# Patient Record
Sex: Female | Born: 1960 | Race: White | Hispanic: No | Marital: Single | Smoking: Current every day smoker
Health system: Southern US, Community
[De-identification: ages and names within clinical notes are randomized; demographics above are authoritative.]

## PROBLEM LIST (undated history)

## (undated) DIAGNOSIS — E785 Hyperlipidemia, unspecified: Secondary | ICD-10-CM

## (undated) DIAGNOSIS — I639 Cerebral infarction, unspecified: Secondary | ICD-10-CM

## (undated) DIAGNOSIS — I10 Essential (primary) hypertension: Secondary | ICD-10-CM

## (undated) HISTORY — PX: TONSILLECTOMY: SUR1361

## (undated) HISTORY — DX: Cerebral infarction, unspecified: I63.9

## (undated) HISTORY — DX: Essential (primary) hypertension: I10

## (undated) HISTORY — PX: TUBAL LIGATION: SHX77

## (undated) HISTORY — DX: Hyperlipidemia, unspecified: E78.5

---

## 2005-04-24 ENCOUNTER — Emergency Department: Payer: Self-pay | Admitting: General Practice

## 2005-05-02 ENCOUNTER — Ambulatory Visit: Payer: Self-pay | Admitting: General Practice

## 2006-10-15 ENCOUNTER — Ambulatory Visit: Payer: Self-pay | Admitting: Obstetrics and Gynecology

## 2006-10-23 ENCOUNTER — Ambulatory Visit: Payer: Self-pay | Admitting: Obstetrics and Gynecology

## 2011-08-05 ENCOUNTER — Ambulatory Visit: Payer: Self-pay

## 2013-01-07 ENCOUNTER — Emergency Department: Payer: Self-pay | Admitting: Emergency Medicine

## 2014-01-02 ENCOUNTER — Emergency Department: Payer: Self-pay | Admitting: Emergency Medicine

## 2014-08-22 ENCOUNTER — Emergency Department: Payer: Self-pay | Admitting: Internal Medicine

## 2015-05-31 LAB — COMP. METABOLIC PANEL (12)
ALT: 12 (ref 3–30)
AST: 13
Albumin Serum: 4.6
Albumin/Globulin Ratio: 1.8
Alkaline Phosphatase: 94
BUN/Creatinine Ratio: 14
BUN: 13
Calcium: 9.6
Carbon Dioxide, Total: 28
Chloride: 101
Creat: 0.92
EGFR (African American): 82
EGFR (Non-African Amer.): 71
Globulin, Total: 2.5
Glucose: 90
Potassium: 4.5
Sodium: 114
Total Bilirubin: 0.3
Total Protein: 7.1 g/dL

## 2015-05-31 LAB — CBC WITH DIFFERENTIAL/PLATELET
Basophils Absolute: 0
Basophils: 1
Eosinophil: 4
Eosinophils Absolute: 0.1
HCT: 37 (ref 29–41)
Hemoglobin: 12.8
Immature Granulocytes: 0
Lymphocytes absolute: 1.5 10*3/uL (ref 0.1–1.8)
Lymphocytes: 53
MCH: 31.1
MCHC: 34.4
MCV: 91 (ref 76–111)
Monocyes absolute: 0.3 10*3/uL (ref 0.1–1)
Monocytes: 10
Neutrophils absolute (GR#): 0.9 10*3/uL — AB (ref 1.7–7.8)
Neutrophils: 32
Platelets: 143
RBC: 4.11
RDW: 13.6
WBC: 2.8

## 2015-05-31 LAB — VITAMIN D, 1,25 + 25-HYDROXY: Vitamin D, 1, 25-Dihydroxy: 29.5

## 2015-05-31 LAB — HEMOGLOBIN A1C: Hemoglobin A1C: 4.9

## 2015-05-31 LAB — LIPID PANEL WITH LDL/HDL RATIO
Cholesterol, Total: 173
HDL Cholesterol: 35 (ref 35–70)
LDL Cholesterol (Calc): 87
LDl/HDL Ratio: 2.5
Triglycerides: 253 — AB (ref 40–160)
VLDL Cholesterol Cal: 51

## 2015-05-31 LAB — RHEUMATOID FACTOR: RA Latex Turbid.: <10

## 2015-05-31 LAB — TSH: TSH: 2.08

## 2015-05-31 LAB — SEDIMENTATION RATE: Sedimentation Rate-Westergren: 18

## 2015-05-31 LAB — HCV ANTIBODY: Hep C Virus Ab: >11

## 2015-05-31 LAB — ANTINUCLEAR ANTIBODIES, IFA: ANA Ab, IFA: NEGATIVE

## 2015-06-06 LAB — CBC WITH DIFFERENTIAL/PLATELET
Basophils Absolute: 0
Basophils: 0
Eosinophil: 3
Eosinophils Absolute: 0.1
HCT: 34 (ref 29–41)
Hemoglobin: 11.7
Immature Granulocytes: 0
Lymphocytes absolute: 1.6 10*3/uL (ref 0.1–1.8)
Lymphocytes: 48
MCH: 30.8
MCHC: 34.6
MCV: 89 (ref 76–111)
Monocyes absolute: 0.3 10*3/uL (ref 0.1–1)
Monocytes: 10
Neutrophils absolute (GR#): 1.3 10*3/uL — AB (ref 1.7–7.8)
Neutrophils: 39
Platelets: 137
RBC: 3.8
RDW: 13.6
WBC: 3.2

## 2015-07-11 LAB — RPR: RPR: NONREACTIVE

## 2015-07-11 LAB — HM HIV SCREENING LAB: HIV Screen 4th Generation wRfx: NONREACTIVE

## 2015-07-12 LAB — GC/CHLAMYDIA PROBE AMP, URINE
Chlamydia trachomatis, NAA: NEGATIVE
Neisseria gonorrhoeae, NAA: NEGATIVE

## 2015-08-06 ENCOUNTER — Ambulatory Visit: Payer: Self-pay

## 2015-08-08 ENCOUNTER — Ambulatory Visit
Admission: RE | Admit: 2015-08-08 | Discharge: 2015-08-08 | Disposition: A | Discharge status: Home / Self Care | Payer: Self-pay | Source: Ambulatory Visit | Attending: Oncology | Admitting: Oncology

## 2015-08-08 ENCOUNTER — Ambulatory Visit: Payer: Self-pay | Attending: Oncology

## 2015-08-08 VITALS — BP 154/97 | HR 73 | Temp 97.0°F | Resp 18 | Ht 66.1 in | Wt 166.7 lb

## 2015-08-08 DIAGNOSIS — Z0000 Encounter for general adult medical examination without abnormal findings: Secondary | ICD-10-CM

## 2015-08-08 NOTE — Progress Notes (Signed)
Subjective:     Patient ID: Desiree Sexton, female   DOB: [birth year], 54 y.o.   MRN: [id number]  HPI   Review of Systems     Objective:   Physical Exam Physical Exam  Pulmonary/Chest: Right breast exhibits no inverted nipple, no mass, no nipple discharge, no skin change and no tenderness. Left breast exhibits no inverted nipple, no mass, no nipple discharge, no skin change and no tenderness. Breasts are symmetrical.      Assessment:     54 year old patient presents for [facility] clinic visitPatient screened, and meets [facility] eligibility.  Patient does not have insurance, Medicare or Medicaid.  Handout given on Affordable Care Act. CBE unremarkable.  Instructed patient on breast self-exam using teach back method .    Plan:     Sent for bilateral screening mammogram.

## 2015-08-08 NOTE — Progress Notes (Signed)
Subjective:     Patient ID: Desiree Sexton, female   DOB: November 16, 1960, 54 y.o.   MRN: [id number]  HPI   Review of Systems     Objective:   Physical Exam  Pulmonary/Chest: Right breast exhibits no inverted nipple, no mass, no nipple discharge, no skin change and no tenderness. Left breast exhibits no inverted nipple, no mass, no nipple discharge, no skin change and no tenderness. Breasts are symmetrical.       Assessment:     54 [facility] Patient screened, and meets [facility] eligibility.  Patient does not have insurance, Medicare or Medicaid.  Handout given on Affordable Care Act. CBE unremarkable. Instructed patient on breast self-exam using teach back method.     Plan:     Sent for bilateral screening mammogram.

## 2015-11-19 NOTE — Progress Notes (Signed)
Copy to [facility]. 

## 2016-12-03 ENCOUNTER — Ambulatory Visit: Payer: Self-pay | Attending: Oncology

## 2016-12-03 ENCOUNTER — Encounter (INDEPENDENT_AMBULATORY_CARE_PROVIDER_SITE_OTHER): Payer: Self-pay

## 2016-12-03 ENCOUNTER — Ambulatory Visit
Admission: RE | Admit: 2016-12-03 | Discharge: 2016-12-03 | Disposition: A | Discharge status: Home / Self Care | Payer: Self-pay | Source: Ambulatory Visit | Attending: Oncology | Admitting: Oncology

## 2016-12-03 VITALS — BP 130/84 | HR 77 | Temp 96.2°F | Resp 18 | Ht 66.0 in | Wt 160.0 lb

## 2016-12-03 DIAGNOSIS — Z0000 Encounter for general adult medical examination without abnormal findings: Secondary | ICD-10-CM

## 2016-12-03 NOTE — Progress Notes (Signed)
Subjective:     Patient ID: Desiree Sexton, female   DOB: 09-05-1960, 56 y.o.   MRN: [id number]  HPI   Review of Systems     Objective:   Physical Exam  Pulmonary/Chest: Right breast exhibits no inverted nipple, no mass, no nipple discharge, no skin change and no tenderness. Left breast exhibits no inverted nipple, no mass, no nipple discharge, no skin change and no tenderness. Breasts are symmetrical.       Assessment:     56 year old patient presents for [facility] clinic visit.  Patient screened, and meets B[facility] eligibility.  Patient does not have insurance, Medicare or Medicaid.  Handout given on Affordable Care Act.  Instructed patient on breast self-exam using teach back method  CBE unremarkable. No mass or lump palpated.  Patient complains of bilateral side pain x 4 weeks after pushing a dresser to move it.  States she felt a pop at the time.  Encouraged her to follow-up with her PCP about this pain.  She has not tried any over the counter medication. May try Ibuprofen after checking with her pharmacist.   Per [facility], patient had a negative pap in November 2016. Plan:     Sent for bilateral screening mammogram.

## 2016-12-09 NOTE — Progress Notes (Signed)
Letter mailed from [facility] to notify of normal mammogram results.  Patient to return in one year for annual screening.  Copy to [facility]. 

## 2017-06-11 ENCOUNTER — Encounter: Payer: Self-pay | Admitting: Emergency Medicine

## 2017-06-11 ENCOUNTER — Emergency Department
Admission: EM | Admit: 2017-06-11 | Discharge: 2017-06-11 | Disposition: A | Discharge status: Home / Self Care | Payer: No Typology Code available for payment source | Attending: Emergency Medicine | Admitting: Emergency Medicine

## 2017-06-11 DIAGNOSIS — R51 Headache: Secondary | ICD-10-CM | POA: Insufficient documentation

## 2017-06-11 DIAGNOSIS — Z79899 Other long term (current) drug therapy: Secondary | ICD-10-CM | POA: Diagnosis not present

## 2017-06-11 DIAGNOSIS — M549 Dorsalgia, unspecified: Secondary | ICD-10-CM | POA: Diagnosis not present

## 2017-06-11 DIAGNOSIS — M7918 Myalgia, other site: Secondary | ICD-10-CM | POA: Diagnosis not present

## 2017-06-11 DIAGNOSIS — M542 Cervicalgia: Secondary | ICD-10-CM | POA: Diagnosis not present

## 2017-06-11 MED ORDER — CYCLOBENZAPRINE HCL 5 MG PO TABS: 5.0000 mg | ORAL_TABLET | Freq: Three times a day (TID) | ORAL | 0 refills | Status: DC | PRN

## 2017-06-11 MED ORDER — MELOXICAM 15 MG PO TABS: 15.0000 mg | ORAL_TABLET | Freq: Every day | ORAL | 0 refills | Status: AC

## 2017-06-11 MED ORDER — MELOXICAM 7.5 MG PO TABS
15.0000 mg | ORAL_TABLET | Freq: Once | ORAL | Status: AC
Start: 2017-06-11 — End: 2017-06-11
  Administered 2017-06-11: 15 mg via ORAL
  Filled 2017-06-11: qty 2
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
## 2018-01-04 LAB — COMP. METABOLIC PANEL (12)
ALT: 15 (ref 3–30)
AST: 22
Albumin/Globulin Ratio: 1.8
Albumin: 4.6
Alkaline Phosphatase: 101
BUN/Creatinine Ratio: 12
BUN: 9
Calcium: 9.8
Carbon Dioxide, Total: 23
Chloride: 104
Creat: 0.75
EGFR (African American): 104
EGFR (Non-African Amer.): 90
Globulin, Total: 2.5
Glucose: 93
Potassium: 4
Sodium: 142
Total Bilirubin: 0.2
Total Protein: 7.1 g/dL

## 2018-01-04 LAB — LIPID PANEL WITH LDL/HDL RATIO
Cholesterol, Total: 214
HDL Cholesterol: 45 (ref 35–70)
LDL Cholesterol (Calc): 115
LDl/HDL Ratio: 2.6
Triglycerides: 272 — AB (ref 40–160)
VLDL Cholesterol Cal: 54

## 2018-01-04 LAB — CBC WITH DIFFERENTIAL/PLATELET
Basophils Absolute: 0
Basophils: 1
Eosinophil: 5
Eosinophils Absolute: 0.3
HCT: 42 — AB (ref 29–41)
Hemoglobin: 14.8
Immature Granulocytes: 0
Lymphocytes absolute: 2.3 10*3/uL — AB (ref 0.1–1.8)
Lymphocytes: 36
MCH: 33.5
MCHC: 35.4
MCV: 95 (ref 76–111)
Monocyes absolute: 0.3 10*3/uL (ref 0.1–1)
Monocytes: 5
Neutrophils absolute (GR#): 3.4 10*3/uL (ref 1.7–7.8)
Neutrophils: 53
Platelets: 202
RBC: 4.42
RDW: 13.2
WBC: 6.3

## 2018-01-04 LAB — TSH: TSH: 1.65

## 2018-01-04 LAB — HEMOGLOBIN A1C: Hemoglobin A1C: 5

## 2018-01-04 LAB — HCV GENOTYPING NON REFLEX

## 2018-05-15 ENCOUNTER — Encounter: Payer: Self-pay | Admitting: Emergency Medicine

## 2018-05-15 ENCOUNTER — Other Ambulatory Visit: Payer: Self-pay

## 2018-05-15 ENCOUNTER — Emergency Department
Admission: EM | Admit: 2018-05-15 | Discharge: 2018-05-15 | Disposition: A | Discharge status: Home / Self Care | Payer: Self-pay | Attending: Emergency Medicine | Admitting: Emergency Medicine

## 2018-05-15 DIAGNOSIS — H6121 Impacted cerumen, right ear: Secondary | ICD-10-CM | POA: Insufficient documentation

## 2018-05-15 DIAGNOSIS — F17210 Nicotine dependence, cigarettes, uncomplicated: Secondary | ICD-10-CM | POA: Insufficient documentation

## 2018-05-15 DIAGNOSIS — Z79899 Other long term (current) drug therapy: Secondary | ICD-10-CM | POA: Insufficient documentation

## 2018-05-15 MED ORDER — OFLOXACIN 0.3 % OP SOLN: OPHTHALMIC | 0 refills | Status: DC

## 2018-05-15 NOTE — ED Triage Notes (Signed)
R earache x 3 weeks, yesterday cleaned with q tip and thinks left part of q tip in it.

## 2018-05-15 NOTE — ED Provider Notes (Signed)
[facility] Emergency Department Provider Note  Time seen: 11:36 PM  I have reviewed the triage vital signs and the nursing notes.   HISTORY  Chief Complaint Otalgia    HPI Desiree Sexton is a 57 y.o. female with no significant past medical history presents to the emergency department for 3 weeks of right ear pain and fullness.  According to the patient for the past 3 weeks she has been having pain in the right ear and having difficulty hearing out of the right ear.  States mild congestion as well denies any cough.  Largely negative review of systems otherwise.   History reviewed. No pertinent past medical history.  There are no active problems to display for this patient.   History reviewed. No pertinent surgical history.  Prior to Admission medications   Medication Sig Start Date End Date Taking? Authorizing Provider  cyclobenzaprine (FLEXERIL) 10 MG tablet Take 10 mg by mouth at bedtime.    [provider]  cyclobenzaprine (FLEXERIL) 5 MG tablet Take 1 tablet (5 mg total) by mouth 3 (three) times daily as needed for muscle spasms. 06/11/17   Menshew, Charlesetta Ivory, PA-C  hydrOXYzine (ATARAX/VISTARIL) 10 MG tablet Take 10 mg by mouth 3 (three) times daily as needed for anxiety.    [provider]  meloxicam (MOBIC) 15 MG tablet Take 1 tablet (15 mg total) by mouth daily. 06/11/17 06/11/18  Menshew, Charlesetta Ivory, PA-C    No Known Allergies  Family History  Problem Relation Age of Onset  . Breast cancer Paternal Grandmother 50    Social History Social History   Tobacco Use  . Smoking status: Current Every Day Smoker    Packs/day: 0.10    Types: Cigarettes  Substance Use Topics  . Alcohol use: Not on file  . Drug use: Not on file    Review of Systems Constitutional: Negative for fever. ENT: Right ear pain and fullness Cardiovascular: Negative for chest pain. Respiratory: Negative for shortness of  breath. Gastrointestinal: Negative for abdominal pain Musculoskeletal: Negative for musculoskeletal complaints Neurological: Negative for headache All other ROS negative  ____________________________________________   PHYSICAL EXAM:  VITAL SIGNS: ED Triage Vitals [05/15/18 2141]  Enc Vitals Group     BP (!) 156/90     Pulse Rate 84     Resp 18     Temp 98 F (36.7 C)     Temp Source Oral     SpO2 94 %     Weight 174 lb (78.9 kg)     Height 5\' 8"  (1.727 m)     Head Circumference      Peak Flow      Pain Score 6     Pain Loc      Pain Edu?      Excl. in GC?    Constitutional: Alert and oriented. Well appearing and in no distress. Eyes: Normal exam ENT   Head: Normocephalic and atraumatic.  Patient has a fairly normal-appearing left ear/left tympanic membrane with a mild amount of wax.  Patient's right ear is completely full of wax, there is also mild erythema of the external auditory canal.  Patient states she has been using Q-tips in the ear which could have resulted in some trauma.   Mouth/Throat: Mucous membranes are moist. Cardiovascular: Normal rate, regular rhythm. No murmur Respiratory: Normal respiratory effort without tachypnea nor retractions. Breath sounds are clear  Gastrointestinal: Soft and nontender. No distention.   Musculoskeletal: Nontender with  normal range of motion in all extremities.  Neurologic:  Normal speech and language. No gross focal neurologic deficits  Skin:  Skin is warm, dry and intact.  Psychiatric: Mood and affect are normal.   ____________________________________________   INITIAL IMPRESSION / ASSESSMENT AND PLAN / ED COURSE  Pertinent labs & imaging results that were available during my care of the patient were reviewed by me and considered in my medical decision making (see chart for details).  Patient presents emergency department for right ear fullness and discomfort ongoing for 3 weeks.  On exam patient's right ear is  completely full of wax, there is mild erythema of the external auditory canal patient has been using Q-tips which could have had some mild trauma to the area.  I used curettes to removed a significant amount of wax canal clearly see the tympanic membrane.  Given the erythema and possible trauma of the external auditory canal we will place the patient on antibiotic drops for the ear.  Patient will follow-up with her doctor.  ____________________________________________   FINAL CLINICAL IMPRESSION(S) / ED DIAGNOSES  Right ear cerumen    Minna Antis, MD 05/15/18 2339

## 2019-08-07 LAB — SARS-COV-2, NAA 2 DAY TAT: SARS-CoV-2, NAA 2 DAY TAT: NOT DETECTED

## 2020-06-07 ENCOUNTER — Other Ambulatory Visit: Payer: Self-pay

## 2020-06-07 ENCOUNTER — Emergency Department: Payer: Self-pay

## 2020-06-07 ENCOUNTER — Inpatient Hospital Stay: Payer: Self-pay

## 2020-06-07 ENCOUNTER — Inpatient Hospital Stay
Admission: EM | Admit: 2020-06-07 | Discharge: 2020-06-09 | DRG: 064 | Disposition: A | Discharge status: Home / Self Care | Payer: Self-pay | Attending: Internal Medicine | Admitting: Internal Medicine

## 2020-06-07 ENCOUNTER — Encounter: Payer: Self-pay | Admitting: Emergency Medicine

## 2020-06-07 DIAGNOSIS — F1410 Cocaine abuse, uncomplicated: Secondary | ICD-10-CM | POA: Diagnosis present

## 2020-06-07 DIAGNOSIS — I10 Essential (primary) hypertension: Secondary | ICD-10-CM | POA: Diagnosis present

## 2020-06-07 DIAGNOSIS — I6602 Occlusion and stenosis of left middle cerebral artery: Secondary | ICD-10-CM | POA: Diagnosis present

## 2020-06-07 DIAGNOSIS — S061X9A Traumatic cerebral edema with loss of consciousness of unspecified duration, initial encounter: Secondary | ICD-10-CM | POA: Diagnosis present

## 2020-06-07 DIAGNOSIS — I639 Cerebral infarction, unspecified: Secondary | ICD-10-CM | POA: Diagnosis present

## 2020-06-07 DIAGNOSIS — R4182 Altered mental status, unspecified: Secondary | ICD-10-CM | POA: Diagnosis present

## 2020-06-07 DIAGNOSIS — Y92410 Unspecified street and highway as the place of occurrence of the external cause: Secondary | ICD-10-CM

## 2020-06-07 DIAGNOSIS — Z720 Tobacco use: Secondary | ICD-10-CM | POA: Diagnosis present

## 2020-06-07 DIAGNOSIS — I672 Cerebral atherosclerosis: Secondary | ICD-10-CM | POA: Diagnosis present

## 2020-06-07 DIAGNOSIS — R531 Weakness: Secondary | ICD-10-CM | POA: Diagnosis present

## 2020-06-07 DIAGNOSIS — F17210 Nicotine dependence, cigarettes, uncomplicated: Secondary | ICD-10-CM | POA: Diagnosis present

## 2020-06-07 DIAGNOSIS — Z79899 Other long term (current) drug therapy: Secondary | ICD-10-CM

## 2020-06-07 DIAGNOSIS — R260 Ataxic gait: Secondary | ICD-10-CM | POA: Diagnosis present

## 2020-06-07 DIAGNOSIS — Z803 Family history of malignant neoplasm of breast: Secondary | ICD-10-CM

## 2020-06-07 DIAGNOSIS — E785 Hyperlipidemia, unspecified: Secondary | ICD-10-CM | POA: Diagnosis present

## 2020-06-07 DIAGNOSIS — R2681 Unsteadiness on feet: Secondary | ICD-10-CM | POA: Diagnosis present

## 2020-06-07 DIAGNOSIS — I63533 Cerebral infarction due to unspecified occlusion or stenosis of bilateral posterior cerebral arteries: Secondary | ICD-10-CM | POA: Diagnosis present

## 2020-06-07 DIAGNOSIS — E781 Pure hyperglyceridemia: Secondary | ICD-10-CM | POA: Diagnosis present

## 2020-06-07 DIAGNOSIS — W010XXA Fall on same level from slipping, tripping and stumbling without subsequent striking against object, initial encounter: Secondary | ICD-10-CM | POA: Diagnosis present

## 2020-06-07 DIAGNOSIS — R001 Bradycardia, unspecified: Secondary | ICD-10-CM | POA: Diagnosis present

## 2020-06-07 DIAGNOSIS — I63511 Cerebral infarction due to unspecified occlusion or stenosis of right middle cerebral artery: Secondary | ICD-10-CM | POA: Diagnosis not present

## 2020-06-07 DIAGNOSIS — Z20822 Contact with and (suspected) exposure to covid-19: Secondary | ICD-10-CM | POA: Diagnosis present

## 2020-06-07 DIAGNOSIS — R270 Ataxia, unspecified: Secondary | ICD-10-CM

## 2020-06-07 DIAGNOSIS — I63531 Cerebral infarction due to unspecified occlusion or stenosis of right posterior cerebral artery: Secondary | ICD-10-CM | POA: Diagnosis present

## 2020-06-07 LAB — COMPREHENSIVE METABOLIC PANEL
ALT: 15 U/L (ref 0–44)
AST: 20 U/L (ref 15–41)
Albumin: 4.3 g/dL (ref 3.5–5.0)
Alkaline Phosphatase: 86 U/L (ref 38–126)
Anion gap: 12 (ref 5–15)
BUN: 16 mg/dL (ref 6–20)
CO2: 22 mmol/L (ref 22–32)
Calcium: 9.5 mg/dL (ref 8.9–10.3)
Chloride: 106 mmol/L (ref 98–111)
Creatinine, Ser: 0.87 mg/dL (ref 0.44–1.00)
GFR, Estimated: >60 mL/min (ref >60)
Glucose, Bld: 101 mg/dL — ABNORMAL HIGH (ref 70–99)
Potassium: 4.1 mmol/L (ref 3.5–5.1)
Sodium: 140 mmol/L (ref 135–145)
Total Bilirubin: 0.8 mg/dL (ref 0.3–1.2)
Total Protein: 6.8 g/dL (ref 6.5–8.1)

## 2020-06-07 LAB — URINALYSIS, ROUTINE W REFLEX MICROSCOPIC
Bacteria, UA: NONE SEEN
Glucose, UA: NEGATIVE mg/dL
Hgb urine dipstick: NEGATIVE
Ketones, ur: 5 mg/dL — AB
Nitrite: NEGATIVE
Protein, ur: 30 mg/dL — AB
Specific Gravity, Urine: 1.036 — ABNORMAL HIGH (ref 1.005–1.030)
pH: 5 (ref 5.0–8.0)

## 2020-06-07 LAB — CBC
HCT: 39.1 % (ref 36.0–46.0)
Hemoglobin: 14.2 g/dL (ref 12.0–15.0)
MCH: 34.5 pg — ABNORMAL HIGH (ref 26.0–34.0)
MCHC: 36.3 g/dL — ABNORMAL HIGH (ref 30.0–36.0)
MCV: 94.9 fL (ref 80.0–100.0)
Platelets: 182 10*3/uL (ref 150–400)
RBC: 4.12 MIL/uL (ref 3.87–5.11)
RDW: 11.8 % (ref 11.5–15.5)
WBC: 5.2 10*3/uL (ref 4.0–10.5)
nRBC: 0 % (ref 0.0–0.2)

## 2020-06-07 LAB — APTT: aPTT: 55 seconds — ABNORMAL HIGH (ref 24–36)

## 2020-06-07 LAB — RESPIRATORY PANEL BY RT PCR (FLU A&B, COVID)
Influenza A by PCR: NEGATIVE
Influenza B by PCR: NEGATIVE
SARS Coronavirus 2 by RT PCR: NEGATIVE

## 2020-06-07 LAB — DIFFERENTIAL
Abs Immature Granulocytes: 0.01 10*3/uL (ref 0.00–0.07)
Basophils Absolute: 0 10*3/uL (ref 0.0–0.1)
Basophils Relative: 1 %
Eosinophils Absolute: 0.1 10*3/uL (ref 0.0–0.5)
Eosinophils Relative: 1 %
Immature Granulocytes: 0 %
Lymphocytes Relative: 44 %
Lymphs Abs: 2.2 10*3/uL (ref 0.7–4.0)
Monocytes Absolute: 0.3 10*3/uL (ref 0.1–1.0)
Monocytes Relative: 6 %
Neutro Abs: 2.5 10*3/uL (ref 1.7–7.7)
Neutrophils Relative %: 48 %

## 2020-06-07 LAB — PROTIME-INR
INR: 1 (ref 0.8–1.2)
Prothrombin Time: 12.7 seconds (ref 11.4–15.2)

## 2020-06-07 LAB — URINE DRUG SCREEN, QUALITATIVE (ARMC ONLY)
Amphetamines, Ur Screen: NOT DETECTED
Barbiturates, Ur Screen: NOT DETECTED
Benzodiazepine, Ur Scrn: NOT DETECTED
Cannabinoid 50 Ng, Ur ~~LOC~~: NOT DETECTED
Cocaine Metabolite,Ur ~~LOC~~: POSITIVE — AB
MDMA (Ecstasy)Ur Screen: NOT DETECTED
Methadone Scn, Ur: NOT DETECTED
Opiate, Ur Screen: NOT DETECTED
Phencyclidine (PCP) Ur S: NOT DETECTED
Tricyclic, Ur Screen: NOT DETECTED

## 2020-06-07 LAB — LIPID PANEL
Cholesterol: 181 mg/dL (ref 0–200)
HDL: 35 mg/dL — ABNORMAL LOW (ref >40)
LDL Cholesterol: 82 mg/dL (ref 0–99)
Total CHOL/HDL Ratio: 5.2 RATIO
Triglycerides: 319 mg/dL — ABNORMAL HIGH (ref <150)
VLDL: 64 mg/dL — ABNORMAL HIGH (ref 0–40)

## 2020-06-07 LAB — TROPONIN I (HIGH SENSITIVITY)
Troponin I (High Sensitivity): 5 ng/L (ref <18)
Troponin I (High Sensitivity): 5 ng/L (ref <18)

## 2020-06-07 LAB — ETHANOL: Alcohol, Ethyl (B): <10 mg/dL (ref <10)

## 2020-06-07 MED ORDER — IOHEXOL 350 MG/ML SOLN
75.0000 mL | Freq: Once | INTRAVENOUS | Status: AC | PRN
  Administered 2020-06-07: 75 mL via INTRAVENOUS

## 2020-06-07 MED ORDER — LACTATED RINGERS IV BOLUS
1000.0000 mL | Freq: Once | INTRAVENOUS | Status: AC
  Administered 2020-06-07: 1000 mL via INTRAVENOUS

## 2020-06-07 MED ORDER — ACETAMINOPHEN 650 MG RE SUPP: 650.0000 mg | RECTAL | Status: DC | PRN

## 2020-06-07 MED ORDER — STROKE: EARLY STAGES OF RECOVERY BOOK: Freq: Once | Status: DC

## 2020-06-07 MED ORDER — SENNOSIDES-DOCUSATE SODIUM 8.6-50 MG PO TABS: 1.0000 | ORAL_TABLET | Freq: Every evening | ORAL | Status: DC | PRN

## 2020-06-07 MED ORDER — ACETAMINOPHEN 160 MG/5ML PO SOLN
650.0000 mg | ORAL | Status: DC | PRN
  Filled 2020-06-07: qty 20.3

## 2020-06-07 MED ORDER — PROCHLORPERAZINE EDISYLATE 10 MG/2ML IJ SOLN
10.0000 mg | Freq: Once | INTRAMUSCULAR | Status: AC
  Administered 2020-06-07: 10 mg via INTRAVENOUS
  Filled 2020-06-07: qty 2

## 2020-06-07 MED ORDER — ACETAMINOPHEN 500 MG PO TABS
1000.0000 mg | ORAL_TABLET | Freq: Once | ORAL | Status: AC
  Administered 2020-06-07: 1000 mg via ORAL
  Filled 2020-06-07: qty 2

## 2020-06-07 MED ORDER — SODIUM CHLORIDE 0.9 % IV SOLN: INTRAVENOUS | Status: DC

## 2020-06-07 MED ORDER — ACETAMINOPHEN 325 MG PO TABS
650.0000 mg | ORAL_TABLET | ORAL | Status: DC | PRN
  Administered 2020-06-08: 21:00:00 650 mg via ORAL
  Filled 2020-06-07 (×2): qty 2

## 2020-06-07 MED ORDER — ENOXAPARIN SODIUM 40 MG/0.4ML ~~LOC~~ SOLN
40.0000 mg | SUBCUTANEOUS | Status: DC
  Administered 2020-06-07 – 2020-06-08 (×2): 40 mg via SUBCUTANEOUS
  Filled 2020-06-07 (×2): qty 0.4

## 2020-06-07 NOTE — H&P (Signed)
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  #3 cocaine abuse: Counseling provided.  #4 tobacco abuse: Nicotine patch with counseling   DVT prophylaxis: Lovenox Code Status: Full code Family Communication: No family at bedside Disposition Plan: Home Consults called: Neurology to be consulted Admission status: Inpatient  Severity of Illness: The appropriate patient status for this patient is INPATIENT. Inpatient status is judged to be reasonable and necessary in order to provide the required intensity of service to ensure the patient's safety. The patient's presenting symptoms, physical exam findings, and initial radiographic and laboratory data in the context of their chronic comorbidities is felt to place them at high risk for further clinical deterioration. Furthermore, it is not anticipated that the patient will be medically stable for discharge from the hospital within 2 midnights of admission. The following factors support the patient status of inpatient.   " The patient's presenting symptoms  include confusion and headache. " The worrisome physical exam findings include no focal weakness. " The initial radiographic and laboratory data are worrisome because of CTA findings and MRI findings of stroke. " The chronic co-morbidities include tobacco abuse.   * I certify that at the point of admission it is my clinical judgment that the patient will require inpatient hospital care spanning beyond 2 midnights from the point of admission due to high intensity of service, high risk for further deterioration and high frequency of surveillance required.Lonia Blood MD [facility] Pager [phone]  If  7PM-7AM, please contact night-coverage [email or web address] Password [email or web address]  06/07/2020, 11:48 PM
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
## 2020-06-08 ENCOUNTER — Inpatient Hospital Stay
Admit: 2020-06-08 | Discharge: 2020-06-08 | Disposition: A | Discharge status: Home / Self Care | Payer: Self-pay | Attending: Internal Medicine | Admitting: Internal Medicine

## 2020-06-08 ENCOUNTER — Encounter: Payer: Self-pay | Admitting: Internal Medicine

## 2020-06-08 DIAGNOSIS — R001 Bradycardia, unspecified: Secondary | ICD-10-CM

## 2020-06-08 DIAGNOSIS — I639 Cerebral infarction, unspecified: Secondary | ICD-10-CM

## 2020-06-08 DIAGNOSIS — R270 Ataxia, unspecified: Secondary | ICD-10-CM

## 2020-06-08 DIAGNOSIS — E785 Hyperlipidemia, unspecified: Secondary | ICD-10-CM

## 2020-06-08 LAB — LIPID PANEL
Cholesterol: 159 mg/dL (ref 0–200)
HDL: 33 mg/dL — ABNORMAL LOW (ref >40)
LDL Cholesterol: 105 mg/dL — ABNORMAL HIGH (ref 0–99)
Total CHOL/HDL Ratio: 4.8 RATIO
Triglycerides: 105 mg/dL (ref <150)
VLDL: 21 mg/dL (ref 0–40)

## 2020-06-08 LAB — ECHOCARDIOGRAM COMPLETE
AR max vel: 2.88 cm2
AV Area VTI: 2.95 cm2
AV Area mean vel: 2.56 cm2
AV Mean grad: 3 mmHg
AV Peak grad: 4.8 mmHg
Ao pk vel: 1.09 m/s
Area-P 1/2: 2.08 cm2
Height: 68 in
Weight: 2560 oz

## 2020-06-08 LAB — HIV ANTIBODY (ROUTINE TESTING W REFLEX): HIV Screen 4th Generation wRfx: NONREACTIVE

## 2020-06-08 MED ORDER — ASPIRIN 325 MG PO TABS
325.0000 mg | ORAL_TABLET | Freq: Every day | ORAL | Status: DC
  Administered 2020-06-08: 325 mg via ORAL
  Filled 2020-06-08 (×2): qty 1

## 2020-06-08 MED ORDER — AMLODIPINE BESYLATE 5 MG PO TABS
5.0000 mg | ORAL_TABLET | Freq: Every day | ORAL | Status: DC
  Administered 2020-06-08 – 2020-06-09 (×2): 5 mg via ORAL
  Filled 2020-06-08 (×2): qty 1

## 2020-06-08 MED ORDER — ATORVASTATIN CALCIUM 20 MG PO TABS
40.0000 mg | ORAL_TABLET | Freq: Every day | ORAL | Status: DC
  Administered 2020-06-08 – 2020-06-09 (×2): 40 mg via ORAL
  Filled 2020-06-08 (×2): qty 2
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
## 2020-06-08 NOTE — Progress Notes (Signed)
*  PRELIMINARY RESULTS* Echocardiogram 2D Echocardiogram has been performed.  Desiree Sexton 06/08/2020, 9:29 AM
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
## 2020-06-08 NOTE — Consult Note (Signed)
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 dominant. Patent vertebrobasilar junction and basilar artery. There is mid basilar artery irregularity in keeping with atherosclerosis (series 10, image 24) but no stenosis. SCA and PCA origins are patent. But the right PCA is functionally occluded in the P2 segment (series 10, image 23 and series 9, image 51. Very faint distal right PCA enhancement. Left PCA branches are within normal limits. Anterior circulation: Both ICA siphons are patent. On the left there is mild to moderate mostly calcified plaque without stenosis. On the right mild to moderate calcified plaque with mild stenosis at the junction of the petrous  and cavernous segments. Patent carotid termini. Patent MCA and ACA origins. The right A1 origin is mildly stenotic. Anterior communicating artery is normal. The right ACA branches are within normal limits. There is moderate stenosis of the left ACA A2 on series 11, image 15. Left MCA M1 segment is patent without stenosis. The left MCA bifurcation is patent but there is moderate stenosis at the anterior M2 origin best seen on series 9, image 52. No left MCA branch occlusion. Right MCA M1 is irregular with severe mid M1 stenosis (series 9, image 51 and series 10, image 19). The vessel remains patent to the bifurcation, with mild generalized irregularity of the M2 and M3 branches. No right MCA branch occlusion identified. Venous sinuses: Early contrast timing, but grossly patent. Anatomic variants: None. Review of the MIP images confirms the above findings IMPRESSION: 1. Intracranial atherosclerosis is advanced for age, Positive for: - Occluded Right PCA (P2 segment - age indeterminate). - Severe stenosis Right MCA M1 segment. - Moderate stenosis Left MCA anterior M2 division. - Moderate stenosis Left ACA A2. 2. Mild extracranial atherosclerosis. No arterial stenosis in the neck. 3.  No acute traumatic injury identified. Electronically Signed: By: HGenevie AnnM.D. On: 06/07/2020 20:03   MR BRAIN WO CONTRAST  Result Date: 06/07/2020  CLINICAL DATA:  59 year old female with age indeterminate ischemic changes in the right PCA and right MCA territory on plain CT. CTA reveals intracranial atherosclerosis with right PCA occlusion, severe stenosis of the right M1. EXAM: MRI HEAD WITHOUT CONTRAST TECHNIQUE: Multiplanar, multiecho pulse sequences of the brain and surrounding structures were obtained without intravenous contrast. COMPARISON:  CT head and CTA head and neck today. FINDINGS: Brain: Patchy and confluent restricted diffusion throughout much of the right PCA territory. The right thalamus is spared, concordant with the  P2 level of occlusion on CTA. Cytotoxic edema with well developed T2 and FLAIR hyperintensity. T1 signal is mildly diminished in the area, with some superimposed foci of cortical hyperintense T1 signal suggestive of superimposed underlying chronic laminar necrosis. Multifocal right MCA territory areas of mostly facilitated diffusion with mild hyperintensity on trace DWI. Developing encephalomalacia in those areas and evidence of scattered laminar necrosis on T1 weighted imaging. And some of these more chronic appearing changes do abut the right PCA territory (for example series 10, image 14) and there is also scattered hemosiderin in the MCA territory. Superimposed probably more remote lacunar infarcts of the right basal ganglia, posterior internal capsule. Superimposed chronic microhemorrhage in the medial left occipital lobe. Mild to moderate patchy T2 and FLAIR hyperintensity in the pons. Mild to moderate T2 heterogeneity in both thalami. Scattered white matter signal changes in the left hemisphere. No other cortical encephalomalacia identified. No midline shift, mass effect, evidence of mass lesion, ventriculomegaly, extra-axial collection or acute intracranial hemorrhage. Cervicomedullary junction and pituitary are within normal limits. Vascular: Major intracranial vascular flow voids are preserved. Skull and upper cervical spine: Negative. Sinuses/Orbits: Negative. Other: Mastoids are clear. Grossly normal visible internal auditory structures. Scalp and face appear negative. IMPRESSION: 1. Confluent Right PCA territory infarct appears to be early subacute (perhaps between 66 and 63 days old). No associated hemorrhage or mass effect. 2. Superimposed Right MCA ischemia ranging from late subacute to chronic. Associated hemosiderin and laminar necrosis but no mass effect. 3. 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 Chronic microhemorrhage also in the left  occipital lobe. Electronically Signed   By: Genevie Ann M.D.   On: 06/07/2020 21:58   TTE:  1. Left ventricular ejection fraction, by estimation, is 55 to 60%. The  left ventricle has normal function. The left ventricle has no regional  wall motion abnormalities. Left ventricular diastolic parameters were  normal.  2. Right ventricular systolic function is normal. The right ventricular  size is normal.  3. The mitral valve is normal in structure. Trivial mitral valve  regurgitation.  4. The aortic valve is normal in structure. Aortic valve regurgitation is  not visualized.   Assessment: 59 y.o. female presenting with early subacute left PCA territory ischemic infarction and late subacute to chronic right MCA territory ischemia.  1. Exam reveals left homonymous hemianopsia.  2. MRI brain:  Early subacute right PCA territory infarct. Also noted is right MCA ischemia ranging from late subacute to chronic; there is associated hemosiderin and laminar necrosis. Additional signal abnormality compatible with small vessel disease in the bilateral thalami and pons. Chronic microhemorrhage also seen in the left occipital lobe. 3. CTA head reveals multifocal intracranial atherosclerotic stenosis. There is an occluded right PCA P2 segment.  4. CTA neck reveals mild extracranial atherosclerosis without arterial stenosis  5. No mural thrombus or valvular vegetation documented on TTE.  6. Stroke Risk Factors - Atherosclerotic disease as seen on CTA 7.  DDx for underlying etiology of her strokes includes cardioembolic and artery-to-artery embolization.   Recommendations: 1. HgbA1c, fasting lipid panel 2. Telemetry monitoring 3. PT consult, OT consult, Speech consult 4. Has been started on ASA and atorvastatin. Would add Plavix to ASA as her severe intracranial atherosclerotic disease is an indication for DAPT.  5. Risk factor modification 6. Frequent neuro checks 7. ESR, C-reactive protein, ANA.  8.  Hypercoagulable panel.  9. Outpatient Stroke Neurology follow up.  10. If cardiac telemetry is negative, may need to be scheduled for a loop recorder or Holter monitoring.    @Electronically  signed: Dr. Kerney Elbe  06/08/2020, 8:01 AM

## 2020-06-08 NOTE — Evaluation (Signed)
Speech Language Pathology Evaluation Patient Details Name: Desiree Sexton MRN: [id number] DOB: 1961-03-27 Today's Date: 06/08/2020 Time: 0938-1829 SLP Time Calculation (min) (ACUTE ONLY): 15 min  Problem List:  Patient Active Problem List   Diagnosis Date Noted  . Acute cerebrovascular accident (CVA) (HCC) 06/07/2020  . Tobacco abuse 06/07/2020  . Arterial ischemic stroke, PCA, right, acute (HCC) 06/07/2020  . Cocaine abuse (HCC) 06/07/2020   Past Medical History: History reviewed. No pertinent past medical history. Past Surgical History: History reviewed. No pertinent surgical history. HPI:  Desiree Sexton is a 59 y.o. female without significant past medical history who presents accompanied by her mother for assessment of approximately 2 weeks of morning headaches associated with nausea and nonbloody vomiting, flashes of light in her bilateral visual fields and unsteady gait described as "feeling drunk".  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 Additional studies today have shown significant occlusion of her PCA and a lot of vasculopathy intracranially.   Assessment / Plan / Recommendation Clinical Impression    Pt presents with functional cognitive linguistic ablities as evidenced by her ability to recall orientation information, demonstrate selective attention, problem solving and good safety awareness. At this time, skilled ST intervention is not indicated while in acute care. She pt have higher level cognitive deficits at discharge. pt may seek a referral for Outpatient ST if  so indicated.     SLP Assessment  SLP Recommendation/Assessment: Patient does not need any further Speech Lanaguage Pathology Services (May seek Outpatient ST if any higher level deficits arise) SLP Visit Diagnosis: Cognitive communication deficit (R41.841)    Follow Up Recommendations  None (may seek Outpatient St services if needed)    Frequency and Duration   N/A        SLP Evaluation Cognition  Overall Cognitive Status: Within Functional Limits for tasks assessed Arousal/Alertness: Awake/alert Orientation Level: Oriented X4 Attention: Selective Selective Attention: Appears intact Memory: Appears intact Awareness: Appears intact Problem Solving: Appears intact Safety/Judgment: Appears intact       Comprehension  Auditory Comprehension Overall Auditory Comprehension: Appears within functional limits for tasks assessed Visual Recognition/Discrimination Discrimination: Not tested Reading Comprehension Reading Status: Not tested    Expression Expression Primary Mode of Expression: Verbal Verbal Expression Overall Verbal Expression: Appears within functional limits for tasks assessed   Oral / Motor  Oral Motor/Sensory Function Overall Oral Motor/Sensory Function: Within functional limits Motor Speech Overall Motor Speech: Appears within functional limits for tasks assessed   GO                   Geo Slone B. Dreama Saa M.S., CCC-SLP, [facility] Speech-Language Pathologist Rehabilitation Services Office [phone]  Reuel Derby 06/08/2020, 9:46 AM

## 2020-06-08 NOTE — Progress Notes (Signed)
Arriving to floor, patient was alert and oriented x4, however slow to respond to questions. Mother arrived to floor with patient. She was able to verbalize any discomfort, stated her head hurt, where then medication was administered. Got patient settled and attached to heart monitor, where she was registering 30-40 bpm. She has been up to bedside recliner and bathroom. She ambulated without difficulty. Has no distress or residuals. Bed in low position and call bell in reach.
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
## 2020-06-08 NOTE — ED Notes (Signed)
Below message sent to [facility] NP via Epic secure chat: "FYI: Desiree Sexton's pulse is 37-39 on the monitor. Iauscultated at the apex for a full minute and am only getting 42BPM. She is otherwise asymptomatic last BP 140/78."

## 2020-06-09 DIAGNOSIS — E785 Hyperlipidemia, unspecified: Secondary | ICD-10-CM

## 2020-06-09 DIAGNOSIS — F1410 Cocaine abuse, uncomplicated: Secondary | ICD-10-CM

## 2020-06-09 DIAGNOSIS — R001 Bradycardia, unspecified: Secondary | ICD-10-CM

## 2020-06-09 DIAGNOSIS — I10 Essential (primary) hypertension: Secondary | ICD-10-CM

## 2020-06-09 LAB — HEMOGLOBIN A1C
Hgb A1c MFr Bld: 5.2 % (ref 4.8–5.6)
Hgb A1c MFr Bld: 5.2 % (ref 4.8–5.6)
Mean Plasma Glucose: 103 mg/dL
Mean Plasma Glucose: 103 mg/dL

## 2020-06-09 MED ORDER — ASPIRIN EC 81 MG PO TBEC
81.0000 mg | DELAYED_RELEASE_TABLET | Freq: Every day | ORAL | Status: DC
  Administered 2020-06-09: 12:00:00 81 mg via ORAL
  Filled 2020-06-09: qty 1

## 2020-06-09 MED ORDER — AMLODIPINE BESYLATE 5 MG PO TABS
5.0000 mg | ORAL_TABLET | Freq: Every day | ORAL | 1 refills | Status: DC
Start: 2020-06-09 — End: 2021-11-12

## 2020-06-09 MED ORDER — CLOPIDOGREL BISULFATE 75 MG PO TABS
75.0000 mg | ORAL_TABLET | Freq: Every day | ORAL | 2 refills | Status: DC
Start: 2020-06-09 — End: 2021-11-12

## 2020-06-09 MED ORDER — CLOPIDOGREL BISULFATE 75 MG PO TABS
75.0000 mg | ORAL_TABLET | Freq: Every day | ORAL | Status: DC
  Administered 2020-06-09: 12:00:00 75 mg via ORAL
  Filled 2020-06-09: qty 1

## 2020-06-09 MED ORDER — ATORVASTATIN CALCIUM 40 MG PO TABS
40.0000 mg | ORAL_TABLET | Freq: Every day | ORAL | 1 refills | Status: DC
Start: 2020-06-09 — End: 2021-11-12

## 2020-06-09 MED ORDER — ASPIRIN 81 MG PO TBEC
81.0000 mg | DELAYED_RELEASE_TABLET | Freq: Every day | ORAL | 11 refills | Status: DC
Start: 2020-06-09 — End: 2021-11-12

## 2020-06-09 NOTE — Progress Notes (Signed)
Pt is being discharged home.  Discharge papers given and explained to pt.  Pt verbalized understanding.  Meds and f/u appointments reviewed.  Rx sent electronically to the pharmacy.  Pt made aware.  
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
## 2020-06-09 NOTE — Discharge Instructions (Signed)
Patient advised smoking cessation abstain from Using cocaine

## 2020-06-09 NOTE — Discharge Summary (Signed)
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      BP:           105/71 mmHg Patient Gender: F             HR:           38 bpm. Exam Location:  [facility] Procedure: Cardiac Doppler, Color Doppler and 2D Echo Indications:     Stroke 434.91  History:         Patient has no prior history of Echocardiogram examinations. No                  medical history on file.  Sonographer:     Cristela Blue RDCS (AE) Referring Phys:  [id number] Rometta Emery Diagnosing Phys: Arnoldo Hooker MD IMPRESSIONS  1. Left ventricular ejection fraction, by estimation, is 55 to 60%. The left ventricle has normal function. The left ventricle has no regional wall motion abnormalities. Left ventricular diastolic parameters were normal.  2. Right ventricular systolic function is normal. The right ventricular size is normal.  3. The mitral valve is normal in structure. Trivial mitral valve regurgitation.  4. The aortic valve is normal in structure. Aortic valve regurgitation is  not visualized. FINDINGS  Left Ventricle: Left ventricular ejection fraction, by estimation, is 55 to 60%. The left ventricle has normal function. The left ventricle has no regional wall motion abnormalities. The left ventricular internal cavity size was normal in size. There is  no left ventricular hypertrophy. Left ventricular diastolic parameters were normal. Right Ventricle: The right ventricular size is normal. No increase in right ventricular wall thickness. Right ventricular systolic function is normal. Left Atrium: Left atrial  size was normal in size. Right Atrium: Right atrial size was normal in size. Pericardium: There is no evidence of pericardial effusion. Mitral Valve: The mitral valve is normal in structure. Trivial mitral valve regurgitation. Tricuspid Valve: The tricuspid valve is normal in structure. Tricuspid valve regurgitation is trivial. Aortic Valve: The aortic valve is normal in structure. Aortic valve regurgitation is not visualized. Aortic valve mean gradient measures 3.0 mmHg. Aortic valve peak gradient measures 4.8 mmHg. Aortic valve area, by VTI measures 2.95 cm. Pulmonic Valve: The pulmonic valve was normal in structure. Pulmonic valve regurgitation is not visualized. Aorta: The aortic root and ascending aorta are structurally normal, with no evidence of dilitation. IAS/Shunts: No atrial level shunt detected by color flow Doppler.  LEFT VENTRICLE PLAX 2D LVOT diam:     2.00 cm  Diastology LV SV:         99       LV e' medial:    5.33 cm/s LV SV Index:   53       LV E/e' medial:  13.5 LVOT Area:     3.14 cm LV e' lateral:   4.03 cm/s                         LV E/e' lateral: 17.8  RIGHT VENTRICLE RV Basal diam:  2.63 cm RV S prime:     12.50 cm/s TAPSE (M-mode): 3.3 cm LEFT ATRIUM           Index       RIGHT ATRIUM           Index LA Vol (A2C): 80.0 ml 43.04 ml/m RA Area:     12.20 cm LA Vol (A4C): 58.9 ml 31.69 ml/m RA Volume:   27.20 ml  14.63 ml/m  AORTIC VALVE                    PULMONIC VALVE AV Area (Vmax):    2.88 cm    PV Vmax:        0.79 m/s AV Area (Vmean):   2.56 cm    PV Peak grad:   2.5 mmHg AV Area (VTI):     2.95 cm    RVOT Peak grad: 4 mmHg AV Vmax:           109.00 cm/s AV Vmean:          81.150 cm/s AV VTI:            0.336 m AV Peak Grad:      4.8 mmHg AV Mean Grad:      3.0 mmHg LVOT Vmax:         99.80 cm/s LVOT Vmean:        66.000 cm/s LVOT VTI:          0.316 m LVOT/AV VTI ratio: 0.94 MITRAL VALVE               TRICUSPID VALVE MV Area (PHT): 2.08 cm    TR Peak grad:   9.6 mmHg MV Decel Time: 364 msec    TR Vmax:        155.00 cm/s MV E velocity: 71.80 cm/s MV A velocity: 96.00 cm/s  SHUNTS MV E/A ratio:  0.75        Systemic VTI:  0.32 m  Systemic Diam: 2.00 cm Arnoldo HookerBruce Kowalski MD Electronically signed by Arnoldo HookerBruce Kowalski MD Signature Date/Time: 06/08/2020/3:26:10 PM    Final      CODE STATUS:     Code Status Orders  (From admission, onward)         Start     Ordered   06/07/20 2112  Full code  Continuous        06/07/20 2111        Code Status History    This patient has a current code status but no historical code status.   Advance Care Planning Activity       TOTAL TIME TAKING CARE OF THIS PATIENT: *40* minutes.    Enedina FinnerSona Rozelia Catapano M.D  [facility]    CC: Primary care physician; [facility]

## 2020-06-09 NOTE — TOC Transition Note (Signed)
Transition of Care [facility]) - CM/SW Discharge Note   Patient Details  Name: Desiree Sexton MRN: [id number] Date of Birth: 10-09-1960  [facility]) CM/SW Contact:  Allayne Butcher, RN Phone Number: 06/09/2020, 10:03 AM   Clinical Narrative:     Patient admitted to the hospital with CVA.  Patient is medically cleared for discharge back home today.  PT has recommended home health PT, patient does not currently have any insurance and TOC team is unable to find charity home health services or a company that will accept an LOG.  Patient aware that home health services are not available at this time.  Patient instructed to follow up with [facility] in [facility] week for hospital follow up.  Patient given a rolling walker and 3 in 1 to take home, [facility] department provided charity walker, and [employer] provided charity 3 in 1.  Patient reports that she lives in [place] alone and that she does have a boyfriend that will be able to help her at home.  Patient's mother is coming to pick her up today at discharge.    MD has already discussed discharge medications with the patient, prescriptions sent to [facility] and patient is aware of [facility] to assist with cost of medication.  Patient reports that she should have no trouble filling her medications.   Final next level of care: Home/Self Care Barriers to Discharge: Barriers Resolved   Patient Goals and CMS Choice        Discharge Placement                       Discharge Plan and Services   Discharge Planning Services: CM Consult, Medication Assistance            DME Arranged: 3-N-1, Walker rolling DME Agency: [employer] Date DME Agency Contacted: 06/09/20 Time DME Agency Contacted: 1002 Representative spoke with at DME Agency: Oletha Cruel HH Arranged: NA          Social Determinants of Health (SDOH) Interventions     Readmission Risk Interventions No flowsheet data found.

## 2021-09-04 ENCOUNTER — Ambulatory Visit: Payer: Self-pay | Admitting: Pharmacy Technician

## 2021-09-04 ENCOUNTER — Other Ambulatory Visit: Payer: Self-pay

## 2021-09-04 DIAGNOSIS — Z79899 Other long term (current) drug therapy: Secondary | ICD-10-CM

## 2021-09-04 NOTE — Progress Notes (Signed)
Completed [facility] application and contract.  Patient agreed to all terms of the [facility] contract.    Patient approved to receive medication assistance at [facility] until time for re-certification in 0761, and as long as eligibility criteria continues to be met.    Provided patient with community resource material based on her particular needs.    Referring patient to [facility].  Patient stated that she has an outstanding bill with [place] afford to pay.  Provided patient with contact information for [facility].  Provided patient with contact information for [employer] in [place] to follow-up with regarding disability.  Courtney Chiropractor contacted [employer] to have patient's medications transferred.  [facility] indicated that patient had not picked-up medications since June 2022.    Loma Sousa stated that patient will need to provide new prescriptions.  Contacted patient and asked patient to contact provider and ask for prescriptions to be sent to [facility].  Patient stated that she would contact provider on 09/05/21 and have him send to [facility].  [facility]

## 2021-09-06 ENCOUNTER — Other Ambulatory Visit: Payer: Self-pay

## 2021-09-06 MED ORDER — NAPROXEN 500 MG PO TABS
500.0000 mg | ORAL_TABLET | ORAL | 1 refills | Status: DC
  Filled 2021-09-06: qty 60, 30d supply, fill #0
  Filled 2021-10-11: qty 60, 60d supply, fill #0

## 2021-09-06 MED ORDER — AMLODIPINE BESYLATE 5 MG PO TABS
5.0000 mg | ORAL_TABLET | ORAL | 3 refills | Status: DC
  Filled 2021-09-06 – 2021-09-10 (×3): qty 30, 30d supply, fill #0
  Filled 2021-10-11: qty 30, 30d supply, fill #1

## 2021-09-06 MED ORDER — CLOPIDOGREL BISULFATE 75 MG PO TABS
75.0000 mg | ORAL_TABLET | ORAL | 3 refills | Status: DC
  Filled 2021-09-06 – 2021-09-10 (×3): qty 30, 30d supply, fill #0
  Filled 2021-10-11: qty 30, 30d supply, fill #1

## 2021-09-06 MED ORDER — BUTALBITAL-ACETAMINOPHEN 50-325 MG PO TABS: ORAL_TABLET | ORAL | 1 refills | Status: DC

## 2021-09-06 MED ORDER — BUTALBITAL-APAP-CAFFEINE 50-325-40 MG PO TABS: ORAL_TABLET | ORAL | 0 refills | Status: DC

## 2021-09-06 MED ORDER — ATORVASTATIN CALCIUM 40 MG PO TABS
40.0000 mg | ORAL_TABLET | ORAL | 3 refills | Status: DC
  Filled 2021-09-06 – 2021-09-10 (×3): qty 30, 30d supply, fill #0
  Filled 2021-10-11: qty 30, 30d supply, fill #1

## 2021-09-06 MED ORDER — DEBROX 6.5 % OT SOLN: OTIC | 5 refills | Status: DC

## 2021-09-09 ENCOUNTER — Other Ambulatory Visit: Payer: Self-pay

## 2021-09-09 ENCOUNTER — Telehealth: Payer: Self-pay | Admitting: Pharmacy Technician

## 2021-09-09 NOTE — Telephone Encounter (Signed)
Attempted to contact patient to make aware that received transferred prescriptions from [facility] and medications will be ready on 09/10/21.  Unable to reach patient.  Left HIPPA appropriate message.  Sherilyn Dacosta Care Manager [facility]

## 2021-09-10 ENCOUNTER — Telehealth: Payer: Self-pay | Admitting: Pharmacy Technician

## 2021-09-10 ENCOUNTER — Other Ambulatory Visit: Payer: Self-pay

## 2021-09-10 NOTE — Telephone Encounter (Signed)
During patient intake, patient stated that her legal name is Desiree Sexton.  Name in [facility] was Desiree Sexton.  Changed name in [facility] to Desiree Sexton.  However, name on NCD[facility] reflects name as Desiree Sexton.  Patient verbally stated that name on Social Security card is Desiree Sexton as well.  Reached out to Lolly Mustache, [facility].  Per Lennox Laity, name in [facility] should reflect legal name on NCDL and Social Security Card.  Changed name to Desiree Sexton.  Sherilyn Dacosta Care Manager [facility]

## 2021-09-11 ENCOUNTER — Other Ambulatory Visit: Payer: Self-pay

## 2021-09-12 ENCOUNTER — Other Ambulatory Visit: Payer: Self-pay

## 2021-09-13 ENCOUNTER — Other Ambulatory Visit: Payer: Self-pay

## 2021-09-17 ENCOUNTER — Other Ambulatory Visit: Payer: Self-pay

## 2021-10-02 ENCOUNTER — Other Ambulatory Visit: Payer: Self-pay

## 2021-10-03 ENCOUNTER — Encounter: Payer: Self-pay | Admitting: Family Medicine

## 2021-10-03 ENCOUNTER — Other Ambulatory Visit: Payer: Self-pay

## 2021-10-03 ENCOUNTER — Ambulatory Visit: Payer: Self-pay | Admitting: Nurse Practitioner

## 2021-10-03 DIAGNOSIS — A599 Trichomoniasis, unspecified: Secondary | ICD-10-CM

## 2021-10-03 DIAGNOSIS — Z205 Contact with and (suspected) exposure to viral hepatitis: Secondary | ICD-10-CM | POA: Insufficient documentation

## 2021-10-03 DIAGNOSIS — Z113 Encounter for screening for infections with a predominantly sexual mode of transmission: Secondary | ICD-10-CM

## 2021-10-03 LAB — WET PREP FOR TRICH, YEAST, CLUE
Trichomonas Exam: POSITIVE — AB
Yeast Exam: NEGATIVE

## 2021-10-03 LAB — HEPATITIS B SURFACE ANTIGEN

## 2021-10-03 MED ORDER — METRONIDAZOLE 500 MG PO TABS: 500.0000 mg | ORAL_TABLET | Freq: Two times a day (BID) | ORAL | 0 refills | Status: AC

## 2021-10-03 NOTE — Progress Notes (Addendum)
[facility] Department  STI clinic/screening visit [address] [place] [address] [phone]  Subjective:  Desiree Sexton is a 61 y.o. female being seen today for an STI screening visit. The patient reports they do have symptoms.  Patient reports that they do not desire a pregnancy in the next year.   They reported they are not interested in discussing contraception today.    No LMP recorded. Patient is postmenopausal.   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  Patient reports vaginal irritation, itching, discharge, lower abdominal pain, dysuria, pain with sex, and abnormal vaginal bleeding after sex for 2-3 weeks.    Last HIV test per patient/review of record was 06/07/2020 Patient reports last pap was 07/10/2015.   Screening for MPX risk: Does the patient have an unexplained rash? No Is the patient MSM? No Does the patient endorse multiple sex partners or anonymous sex partners? No Did the patient have close or sexual contact with a person diagnosed with MPX? No Has the patient traveled outside the [place] where MPX is endemic? No Is there a high clinical suspicion for MPX-- evidenced by one of the following No  -Unlikely to be chickenpox  -Lymphadenopathy  -Rash that present in same phase of evolution on any given body part See flowsheet for further details and programmatic requirements.    The following portions of the patient's history were reviewed and updated as appropriate: allergies, current  medications, past medical history, past social history, past surgical history and problem list.  Objective:  There were no vitals filed for this visit.  Physical Exam Constitutional:      Appearance: Normal appearance.  HENT:     Head: Normocephalic.     Right Ear: External ear normal.     Left Ear: External ear normal.     Nose: Nose normal.     Mouth/Throat:     Mouth: Mucous membranes are moist.     Comments: Last dental exam many years ago.  Poor detention.   Eyes:     Pupils: Pupils are equal, round, and reactive to light.  Pulmonary:     Effort: Pulmonary effort is normal.  Abdominal:     General: Abdomen is flat.     Palpations: Abdomen is soft.  Genitourinary:    Comments: External genitalia/pubic area without nits, lice, edema, erythema, lesions and inguinal adenopathy. Redness noted to external genitalia.   Vagina irritation noted with moderate amount of discharge.   Cervix without visible lesions. Uterus firm, mobile, nt, no masses, no CMT, no adnexal tenderness or fullness. pH >4.5. Hemorrhoid noted to rectum.   Musculoskeletal:     Cervical back: Full passive range of motion without pain, normal range of motion and neck supple.  Skin:    General: Skin is warm and dry.  Neurological:     Mental Status: She is alert and oriented to person, place, and time.  Psychiatric:        Attention and Perception: Attention normal.  Mood and Affect: Mood normal.        Speech: Speech normal.        Behavior: Behavior is cooperative.     Assessment and Plan:  Desiree Sexton is a 61 y.o. female presenting to the [facility] Department for STI screening  1. Screening examination for venereal disease -61 year old female in clinic today for STD screening. -Patient accepted all screenings including vaginal CT/GC and bloodwork for HIV/RPR, HBV/HCV. Patient meets criteria for HepB screening? Yes. Ordered? Yes Patient meets criteria for HepC screening? Yes.  Ordered? Yes  Treat wet prep per standing order Discussed time line for State [facility] results and that patient will be called with positive results and encouraged patient to call if she had not heard in 2 weeks.  Counseled to return or seek care for continued or worsening symptoms Recommended condom use with all sex  Patient is currently postmenopausal.    - WET PREP FOR TRICH, YEAST, CLUE - HBV Antigen/Antibody State Lab - HIV/HCV [facility] - Syphilis Serology, [place] [facility] - Chlamydia/Gonorrhea  Lab  2. Trichimoniasis -Wet mount reviewed.  Treat patient for Trich.   - metroNIDAZOLE (FLAGYL) 500 MG tablet; Take 1 tablet (500 mg total) by mouth 2 (two) times daily for 7 days.  Dispense: 14 tablet; Refill: 0    Return if symptoms worsen or fail to improve.    Glenna Fellows, FNP

## 2021-10-03 NOTE — Progress Notes (Signed)
Patient seen today STD screening. Wet prep reviewed, medication dispensed and charted. Condoms given. Contact cards given.

## 2021-10-11 ENCOUNTER — Other Ambulatory Visit: Payer: Self-pay

## 2021-10-14 ENCOUNTER — Other Ambulatory Visit: Payer: Self-pay

## 2021-10-16 ENCOUNTER — Telehealth: Payer: Self-pay

## 2021-10-16 NOTE — Telephone Encounter (Signed)
Phone call received back from pt. Pt confirmed identity. Pt counseled regarding test results and questions answered. ?

## 2021-10-16 NOTE — Telephone Encounter (Signed)
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 ? ?Phone call to [phone]. Left message to please call Kalub Morillo, RN with [facility], about TR at [phone]. ?

## 2021-11-12 ENCOUNTER — Encounter: Payer: Self-pay | Admitting: Gerontology

## 2021-11-12 ENCOUNTER — Ambulatory Visit: Payer: Self-pay | Admitting: Gerontology

## 2021-11-12 ENCOUNTER — Other Ambulatory Visit: Payer: Self-pay

## 2021-11-12 VITALS — BP 116/67 | HR 58 | Temp 97.9°F | Ht 67.0 in | Wt 156.7 lb

## 2021-11-12 DIAGNOSIS — Z7689 Persons encountering health services in other specified circumstances: Secondary | ICD-10-CM

## 2021-11-12 DIAGNOSIS — F1410 Cocaine abuse, uncomplicated: Secondary | ICD-10-CM

## 2021-11-12 DIAGNOSIS — G8929 Other chronic pain: Secondary | ICD-10-CM

## 2021-11-12 DIAGNOSIS — E785 Hyperlipidemia, unspecified: Secondary | ICD-10-CM

## 2021-11-12 DIAGNOSIS — Z720 Tobacco use: Secondary | ICD-10-CM

## 2021-11-12 DIAGNOSIS — I10 Essential (primary) hypertension: Secondary | ICD-10-CM

## 2021-11-12 DIAGNOSIS — Z8673 Personal history of transient ischemic attack (TIA), and cerebral infarction without residual deficits: Secondary | ICD-10-CM

## 2021-11-12 DIAGNOSIS — R001 Bradycardia, unspecified: Secondary | ICD-10-CM

## 2021-11-12 MED ORDER — AMLODIPINE BESYLATE 5 MG PO TABS
5.0000 mg | ORAL_TABLET | ORAL | 3 refills | Status: DC
  Filled 2021-11-12: qty 90, 90d supply, fill #0

## 2021-11-12 MED ORDER — ATORVASTATIN CALCIUM 40 MG PO TABS
40.0000 mg | ORAL_TABLET | Freq: Every day | ORAL | 1 refills | Status: DC
  Filled 2021-11-12: qty 90, 90d supply, fill #0

## 2021-11-12 MED ORDER — ASPIRIN 81 MG PO TBEC
81.0000 mg | DELAYED_RELEASE_TABLET | Freq: Every day | ORAL | 11 refills | Status: DC
  Filled 2021-11-12: qty 90, 90d supply, fill #0

## 2021-11-12 MED ORDER — CLOPIDOGREL BISULFATE 75 MG PO TABS
75.0000 mg | ORAL_TABLET | Freq: Every day | ORAL | 2 refills | Status: DC
  Filled 2021-11-12: qty 90, 90d supply, fill #0

## 2021-11-12 MED ORDER — CYCLOBENZAPRINE HCL 10 MG PO TABS
10.0000 mg | ORAL_TABLET | Freq: Every day | ORAL | 0 refills | Status: DC
  Filled 2021-11-12: qty 30, 30d supply, fill #0

## 2021-11-12 NOTE — Patient Instructions (Signed)
Smoking Tobacco Information, Adult ?Smoking tobacco can be harmful to your health. Tobacco contains a poisonous (toxic), colorless chemical called nicotine. Nicotine is addictive. It changes the brain and can make it hard to stop smoking. Tobacco also has other toxic chemicals that can hurt your body and raise your risk of many cancers. ?How can smoking tobacco affect me? ?Smoking tobacco puts you at risk for: ?Cancer. Smoking is most commonly associated with lung cancer, but can also lead to cancer in other parts of the body. ?Chronic obstructive pulmonary disease (COPD). This is a long-term lung condition that makes it hard to breathe. It also gets worse over time. ?High blood pressure (hypertension), heart disease, stroke, or heart attack. ?Lung infections, such as pneumonia. ?Cataracts. This is when the lenses in the eyes become clouded. ?Digestive problems. This may include peptic ulcers, heartburn, and gastroesophageal reflux disease (GERD). ?Oral health problems, such as gum disease and tooth loss. ?Loss of taste and smell. ?Smoking can affect your appearance by causing: ?Wrinkles. ?Yellow or stained teeth, fingers, and fingernails. ?Smoking tobacco can also affect your social life, because: ?It may be challenging to find places to smoke when away from home. Many workplaces, restaurants, hotels, and public places are tobacco-free. ?Smoking is expensive. This is due to the cost of tobacco and the long-term costs of treating health problems from smoking. ?Secondhand smoke may affect those around you. Secondhand smoke can cause lung cancer, breathing problems, and heart disease. Children of smokers have a higher risk for: ?Sudden infant death syndrome (SIDS). ?Ear infections. ?Lung infections. ?If you currently smoke tobacco, quitting now can help you: ?Lead a longer and healthier life. ?Look, smell, breathe, and feel better over time. ?Save money. ?Protect others from the harms of secondhand smoke. ?What  actions can I take to prevent health problems? ?Quit smoking ? ?Do not start smoking. Quit if you already do. ?Make a plan to quit smoking and commit to it. Look for programs to help you and ask your health care provider for recommendations and ideas. ?Set a date and write down all the reasons you want to quit. ?Let your friends and family know you are quitting so they can help and support you. Consider finding friends who also want to quit. It can be easier to quit with someone else, so that you can support each other. ?Talk with your health care provider about using nicotine replacement medicines to help you quit, such as gum, lozenges, patches, sprays, or pills. ?Do not replace cigarette smoking with electronic cigarettes, which are commonly called e-cigarettes. The safety of e-cigarettes is not known, and some may contain harmful chemicals. ?If you try to quit but return to smoking, stay positive. It is common to slip up when you first quit, so take it one day at a time. ?Be prepared for cravings. When you feel the urge to smoke, chew gum or suck on hard candy. ?Lifestyle ?Stay busy and take care of your body. ?Drink enough fluid to keep your urine pale yellow. ?Get plenty of exercise and eat a healthy diet. This can help prevent weight gain after quitting. ?Monitor your eating habits. Quitting smoking can cause you to have a larger appetite than when you smoke. ?Find ways to relax. Go out with friends or family to a movie or a restaurant where people do not smoke. ?Ask your health care provider about having regular tests (screenings) to check for cancer. This may include blood tests, imaging tests, and other tests. ?Find ways to manage   your stress, such as meditation, yoga, or exercise. ?Where to find support ?To get support to quit smoking, consider: ?Asking your health care provider for more information and resources. ?Taking classes to learn more about quitting smoking. ?Looking for local organizations that  offer resources about quitting smoking. ?Joining a support group for people who want to quit smoking in your local community. ?Calling the smokefree.gov counselor helpline: 1-800-Quit-Now (1-800-784-8669) ?Where to find more information ?You may find more information about quitting smoking from: ?HelpGuide.org: www.helpguide.org ?Smokefree.gov: smokefree.gov ?American Lung Association: www.lung.org ?Contact a health care provider if you: ?Have problems breathing. ?Notice that your lips, nose, or fingers turn blue. ?Have chest pain. ?Are coughing up blood. ?Feel faint or you pass out. ?Have other health changes that cause you to worry. ?Summary ?Smoking tobacco can negatively affect your health, the health of those around you, your finances, and your social life. ?Do not start smoking. Quit if you already do. If you need help quitting, ask your health care provider. ?Think about joining a support group for people who want to quit smoking in your local community. There are many effective programs that will help you to quit this behavior. ?This information is not intended to replace advice given to you by your health care provider. Make sure you discuss any questions you have with your health care provider. ?Document Revised: 04/08/2021 Document Reviewed: 06/26/2020 ?Elsevier Patient Education ? 2022 Elsevier Inc. ?DASH Eating Plan ?DASH stands for Dietary Approaches to Stop Hypertension. The DASH eating plan is a healthy eating plan that has been shown to: ?Reduce high blood pressure (hypertension). ?Reduce your risk for type 2 diabetes, heart disease, and stroke. ?Help with weight loss. ?What are tips for following this plan? ?Reading food labels ?Check food labels for the amount of salt (sodium) per serving. Choose foods with less than 5 percent of the Daily Value of sodium. Generally, foods with less than 300 milligrams (mg) of sodium per serving fit into this eating plan. ?To find whole grains, look for the word  "whole" as the first word in the ingredient list. ?Shopping ?Buy products labeled as "low-sodium" or "no salt added." ?Buy fresh foods. Avoid canned foods and pre-made or frozen meals. ?Cooking ?Avoid adding salt when cooking. Use salt-free seasonings or herbs instead of table salt or sea salt. Check with your health care provider or pharmacist before using salt substitutes. ?Do not fry foods. Cook foods using healthy methods such as baking, boiling, grilling, roasting, and broiling instead. ?Cook with heart-healthy oils, such as olive, canola, avocado, soybean, or sunflower oil. ?Meal planning ? ?Eat a balanced diet that includes: ?4 or more servings of fruits and 4 or more servings of vegetables each day. Try to fill one-half of your plate with fruits and vegetables. ?6-8 servings of whole grains each day. ?Less than 6 oz (170 g) of lean meat, poultry, or fish each day. A 3-oz (85-g) serving of meat is about the same size as a deck of cards. One egg equals 1 oz (28 g). ?2-3 servings of low-fat dairy each day. One serving is 1 cup (237 mL). ?1 serving of nuts, seeds, or beans 5 times each week. ?2-3 servings of heart-healthy fats. Healthy fats called omega-3 fatty acids are found in foods such as walnuts, flaxseeds, fortified milks, and eggs. These fats are also found in cold-water fish, such as sardines, salmon, and mackerel. ?Limit how much you eat of: ?Canned or prepackaged foods. ?Food that is high in trans fat, such as   some fried foods. ?Food that is high in saturated fat, such as fatty meat. ?Desserts and other sweets, sugary drinks, and other foods with added sugar. ?Full-fat dairy products. ?Do not salt foods before eating. ?Do not eat more than 4 egg yolks a week. ?Try to eat at least 2 vegetarian meals a week. ?Eat more home-cooked food and less restaurant, buffet, and fast food. ?Lifestyle ?When eating at a restaurant, ask that your food be prepared with less salt or no salt, if possible. ?If you drink  alcohol: ?Limit how much you use to: ?0-1 drink a day for women who are not pregnant. ?0-2 drinks a day for men. ?Be aware of how much alcohol is in your drink. In the [place] one drink equals one 12 oz bottle

## 2021-11-12 NOTE — Progress Notes (Signed)
? ?New Patient Office Visit ? ?Subjective:  ?Patient ID: Desiree Sexton, female    DOB: 10/25/1960  Age: 61 y.o. MRN: [id number] ? ?CC:  ?Chief Complaint  ?Patient presents with  ? Establish Care  ?  Had a stroke 2021. Was at [facility]. Needs referrals to Cardiology, Urology, and radiology imaging for back pain,left sided rib pain. Pt is very concerned about this pain. C/o nausea and stomach pain.  ? ? ?HPI ?Desiree Sexton is a 61 y/o female who has history of Stroke, hypertension, hyperlipidemia and presents to establish care. She states that she's compliant with her medications and denies side effects. She takes Amlodipine 5 mg daily for hypertension, and Atorvastatin 40 mg. She takes Plavix 75 mg, denies hematuria, bruises and hematochezia.  She states that she smokes 1/2 - 1 pack of cigarette smoking and admits the desire to quit. She also reports using cocaine twice weekly and requests referral. She also c/o non traumatic , non radiating dull mid back pain that has been going on for many years,and it's associated with intermittent spasms and takes Naproxen 500 mg daily as needed for pain,  She states that pain is constant and it's 8/10. She denies, bowel nor bladder incontinence and saddle anesthesia. Her heart rate was 58 b.p.m, she denies fatigue, light headedness and shortness of breath. Overall, she states that she's doing well, but concerned about her back pain and offers no complaint. ? ? ?Past Medical History:  ?Diagnosis Date  ? Stroke [facility])   ? ? ?Past Surgical History:  ?Procedure Laterality Date  ? TONSILLECTOMY    ? TUBAL LIGATION    ? ? ?Family History  ?Problem Relation Age of Onset  ? Heart disease Mother   ? Hypertension Mother   ? Alcohol abuse Father   ? Breast cancer Paternal Grandmother 105  ? ? ?Social History  ? ?Socioeconomic History  ? Marital status: Single  ?  Spouse name: Not on file  ? Number of children: Not on file  ? Years of education: Not on file  ? Highest education  level: Not on file  ?Occupational History  ? Not on file  ?Tobacco Use  ? Smoking status: Every Day  ?  Packs/day: 0.50  ?  Types: Cigarettes  ? Smokeless tobacco: Never  ?Vaping Use  ? Vaping Use: Never used  ?Substance and Sexual Activity  ? Alcohol use: Not Currently  ?  Comment: quit 05/2020  ? Drug use: Not Currently  ?  Frequency: 2.0 times per week  ?  Types: Cocaine  ?  Comment: interested in receivng help  ? Sexual activity: Yes  ?  Birth control/protection: Surgical  ?Other Topics Concern  ? Not on file  ?Social History Narrative  ? Not on file  ? ?Social Determinants of Health  ? ?Financial Resource Strain: Not on file  ?Food Insecurity: Not on file  ?Transportation Needs: Unmet Transportation Needs  ? Lack of Transportation (Medical): Yes  ? Lack of Transportation (Non-Medical): Yes  ?Physical Activity: Not on file  ?Stress: Not on file  ?Social Connections: Not on file  ?Intimate Partner Violence: Not At Risk  ? Fear of Current or Ex-Partner: No  ? Emotionally Abused: No  ? Physically Abused: No  ? Sexually Abused: No  ? ? ?ROS ?Review of Systems  ?Constitutional: Negative.   ?HENT: Negative.    ?Eyes: Negative.   ?Respiratory: Negative.    ?Cardiovascular: Negative.   ?Gastrointestinal: Negative.   ?Endocrine: Negative.   ?  Genitourinary: Negative.   ?Musculoskeletal:  Positive for back pain (chronic mid back pain).  ?Skin: Negative.   ?Neurological: Negative.   ?Hematological: Negative.   ?Psychiatric/Behavioral: Negative.    ? ?Objective:  ? ?Today's Vitals: BP 116/67 (BP Location: Right Arm, Patient Position: Sitting, Cuff Size: Large)   Pulse (!) 58   Temp 97.9 ?F (36.6 ?C)   Ht $R'5\' 7"'bo$  (1.702 m)   Wt 156 lb 11.2 oz (71.1 kg)   SpO2 97%   BMI 24.54 kg/m?  ? ?Physical Exam ?HENT:  ?   Head: Normocephalic and atraumatic.  ?   Nose: Nose normal.  ?   Mouth/Throat:  ?   Mouth: Mucous membranes are moist.  ?Eyes:  ?   Extraocular Movements: Extraocular movements intact.  ?   Conjunctiva/sclera:  Conjunctivae normal.  ?   Pupils: Pupils are equal, round, and reactive to light.  ?Cardiovascular:  ?   Rate and Rhythm: Regular rhythm. Bradycardia present.  ?Pulmonary:  ?   Effort: Pulmonary effort is normal.  ?   Breath sounds: Normal breath sounds.  ?Abdominal:  ?   General: Bowel sounds are normal.  ?   Palpations: Abdomen is soft.  ?Genitourinary: ?   Comments: Deferred per patient. ?Musculoskeletal:     ?   General: Tenderness (to mid back with palpation) present. Normal range of motion.  ?   Cervical back: Normal range of motion.  ?Skin: ?   General: Skin is warm.  ?Neurological:  ?   General: No focal deficit present.  ?   Mental Status: She is alert and oriented to person, place, and time. Mental status is at baseline.  ?Psychiatric:     ?   Mood and Affect: Mood normal.     ?   Behavior: Behavior normal.     ?   Thought Content: Thought content normal.     ?   Judgment: Judgment normal.  ? ? ?Assessment & Plan:  ? ? ? ?1. Hyperlipidemia, unspecified hyperlipidemia type ?- She will continue current medication, low fat/cholesterol diet and exercise as tolerated. ?- atorvastatin (LIPITOR) 40 MG tablet; Take 1 tablet (40 mg total) by mouth once daily.  Dispense: 90 tablet; Refill: 1 ? ?2. Tobacco abuse ?- She was encouraged on smoking cessation, provided the [place] Quit line information. ?- CT CHEST LUNG CA SCREEN LOW DOSE W/O CM; Future ? ?3. Primary hypertension ?- Her blood pressure is under control she will continue on current medication, DASH diet and exercise as tolerated. ?- amLODipine (NORVASC) 5 MG tablet; TAKE 1 TABLET BY MOUTH ONCE A DAY  Dispense: 90 tablet; Refill: 3 ? ?4. History of stroke ?- She will continue on current medication, educated on the signs and symptoms of stroke,and to go to the ED.  ?- aspirin 81 MG EC tablet; Take 1 tablet (81 mg total) by mouth once daily. Swallow whole.  Dispense: 30 tablet; Refill: 11 ?- clopidogrel (PLAVIX) 75 MG tablet; Take 1 tablet (75 mg total) by mouth  daily.  Dispense: 30 tablet; Refill: 2 ? ?5. Chronic mid back pain ?- She was started on Flexeril for possible muscle spasm, educated on medication side effects and advised to notify clinic. ?- cyclobenzaprine (FLEXERIL) 10 MG tablet; Take 1 tablet (10 mg total) by mouth once nightly at bedtime.  Dispense: 30 tablet; Refill: 0 ? ?6. Encounter to establish care ?-Routine labs will be checked. ?- CBC w/Diff; Future ?- Comp Met (CMET); Future ?- Lipid panel; Future ?- HgB  A1c; Future ?- Urinalysis; Future ?- Vitamin D (25 hydroxy); Future ? ?7. Cocaine abuse ([place]) ?- She was provided with [facility] information and advised to schedule an appointment. ? ?8. Bradycardia ?- She was advised to increase her water intake, to go to the ED with worsening symptoms. ? ? ? ? ?Follow-up: Return in about 30 days (around 12/12/2021), or if symptoms worsen or fail to improve.  ? ?Ronneisha Jett Jerold Coombe, NP ? ?

## 2021-11-13 ENCOUNTER — Other Ambulatory Visit: Payer: Self-pay

## 2021-12-04 ENCOUNTER — Encounter: Payer: Self-pay | Admitting: Emergency Medicine

## 2021-12-04 ENCOUNTER — Other Ambulatory Visit: Payer: Self-pay

## 2021-12-04 ENCOUNTER — Emergency Department
Admission: EM | Admit: 2021-12-04 | Discharge: 2021-12-04 | Disposition: A | Discharge status: Home / Self Care | Payer: Medicaid Other | Attending: Emergency Medicine | Admitting: Emergency Medicine

## 2021-12-04 ENCOUNTER — Emergency Department: Payer: Medicaid Other

## 2021-12-04 DIAGNOSIS — N132 Hydronephrosis with renal and ureteral calculous obstruction: Secondary | ICD-10-CM | POA: Insufficient documentation

## 2021-12-04 DIAGNOSIS — I10 Essential (primary) hypertension: Secondary | ICD-10-CM | POA: Insufficient documentation

## 2021-12-04 DIAGNOSIS — N200 Calculus of kidney: Secondary | ICD-10-CM

## 2021-12-04 LAB — URINALYSIS, ROUTINE W REFLEX MICROSCOPIC
Bilirubin Urine: NEGATIVE
Glucose, UA: NEGATIVE mg/dL
Ketones, ur: NEGATIVE mg/dL
Leukocytes,Ua: NEGATIVE
Nitrite: NEGATIVE
Protein, ur: 30 mg/dL — AB
RBC / HPF: >50 RBC/hpf — ABNORMAL HIGH (ref 0–5)
Specific Gravity, Urine: 1.035 — ABNORMAL HIGH (ref 1.005–1.030)
Squamous Epithelial / HPF: NONE SEEN (ref 0–5)
pH: 6 (ref 5.0–8.0)

## 2021-12-04 LAB — COMPREHENSIVE METABOLIC PANEL
ALT: 20 U/L (ref 0–44)
AST: 21 U/L (ref 15–41)
Albumin: 4.6 g/dL (ref 3.5–5.0)
Alkaline Phosphatase: 112 U/L (ref 38–126)
Anion gap: 7 (ref 5–15)
BUN: 15 mg/dL (ref 6–20)
CO2: 26 mmol/L (ref 22–32)
Calcium: 9.5 mg/dL (ref 8.9–10.3)
Chloride: 105 mmol/L (ref 98–111)
Creatinine, Ser: 0.98 mg/dL (ref 0.44–1.00)
GFR, Estimated: >60 mL/min (ref >60)
Glucose, Bld: 118 mg/dL — ABNORMAL HIGH (ref 70–99)
Potassium: 3.6 mmol/L (ref 3.5–5.1)
Sodium: 138 mmol/L (ref 135–145)
Total Bilirubin: 0.9 mg/dL (ref 0.3–1.2)
Total Protein: 7.7 g/dL (ref 6.5–8.1)

## 2021-12-04 LAB — CBC
HCT: 43 % (ref 36.0–46.0)
Hemoglobin: 14.6 g/dL (ref 12.0–15.0)
MCH: 30.8 pg (ref 26.0–34.0)
MCHC: 34 g/dL (ref 30.0–36.0)
MCV: 90.7 fL (ref 80.0–100.0)
Platelets: 179 10*3/uL (ref 150–400)
RBC: 4.74 MIL/uL (ref 3.87–5.11)
RDW: 11.9 % (ref 11.5–15.5)
WBC: 6.7 10*3/uL (ref 4.0–10.5)
nRBC: 0 % (ref 0.0–0.2)

## 2021-12-04 LAB — POC URINE PREG, ED: Preg Test, Ur: NEGATIVE

## 2021-12-04 LAB — LIPASE, BLOOD: Lipase: 39 U/L (ref 11–51)

## 2021-12-04 MED ORDER — TAMSULOSIN HCL 0.4 MG PO CAPS: 0.4000 mg | ORAL_CAPSULE | Freq: Every day | ORAL | 1 refills | Status: DC

## 2021-12-04 MED ORDER — ONDANSETRON HCL 4 MG/2ML IJ SOLN
4.0000 mg | Freq: Once | INTRAMUSCULAR | Status: AC
  Administered 2021-12-04: 4 mg via INTRAVENOUS
  Filled 2021-12-04: qty 2

## 2021-12-04 MED ORDER — FENTANYL CITRATE PF 50 MCG/ML IJ SOSY
50.0000 ug | PREFILLED_SYRINGE | Freq: Once | INTRAMUSCULAR | Status: AC
  Administered 2021-12-04: 50 ug via INTRAVENOUS
  Filled 2021-12-04: qty 1

## 2021-12-04 MED ORDER — KETOROLAC TROMETHAMINE 15 MG/ML IJ SOLN
15.0000 mg | Freq: Once | INTRAMUSCULAR | Status: AC
  Administered 2021-12-04: 15 mg via INTRAVENOUS
  Filled 2021-12-04: qty 1

## 2021-12-04 MED ORDER — OXYCODONE-ACETAMINOPHEN 5-325 MG PO TABS: 1.0000 | ORAL_TABLET | Freq: Four times a day (QID) | ORAL | 0 refills | Status: AC | PRN

## 2021-12-04 MED ORDER — HYDROMORPHONE HCL 1 MG/ML IJ SOLN
1.0000 mg | Freq: Once | INTRAMUSCULAR | Status: AC
  Administered 2021-12-04: 1 mg via INTRAVENOUS
  Filled 2021-12-04: qty 1

## 2021-12-04 MED ORDER — IOHEXOL 300 MG/ML  SOLN
80.0000 mL | Freq: Once | INTRAMUSCULAR | Status: AC | PRN
  Administered 2021-12-04: 80 mL via INTRAVENOUS
  Filled 2021-12-04: qty 80

## 2021-12-04 MED ORDER — SODIUM CHLORIDE 0.9 % IV BOLUS
1000.0000 mL | Freq: Once | INTRAVENOUS | Status: AC
  Administered 2021-12-04: 1000 mL via INTRAVENOUS

## 2021-12-04 MED ORDER — ONDANSETRON 4 MG PO TBDP: 4.0000 mg | ORAL_TABLET | Freq: Three times a day (TID) | ORAL | 0 refills | Status: DC | PRN

## 2021-12-04 MED ORDER — PROCHLORPERAZINE EDISYLATE 10 MG/2ML IJ SOLN
10.0000 mg | Freq: Every day | INTRAMUSCULAR | Status: DC
  Administered 2021-12-04: 10 mg via INTRAVENOUS
  Filled 2021-12-04: qty 2

## 2021-12-04 NOTE — ED Triage Notes (Signed)
Pt comes into the ED via POV c/o left side abd pain with N/V that started this morning.  Pt denies any h/o pancreatitis, diverticulitis, etc.  Pt presents uncomfortable in the chair at this time.  Last BM was 2-3 days ago.  PT has even and unlabored respirations at this time.  PT states the pain does radiate to her back as well.  Pt denies any h/o kidney stones.  ?

## 2021-12-04 NOTE — ED Notes (Signed)
First Nurse Note:  Pt to ED via POV for left sided abdominal pain and vomiting. Pt is in NAD.  ?

## 2021-12-04 NOTE — ED Provider Notes (Signed)
? ?[facility] ?Provider Note ? ? ? Event Date/Time  ? First MD Initiated Contact with Patient 12/04/21 [phone]   ?  (approximate) ? ? ?History  ? ?Chief Complaint ?Abdominal Pain and Emesis ? ? ?HPI ?Desiree Sexton is a 61 y.o. female, history of CVA, cocaine abuse, hypertension, hyperlipidemia, presents to the emergency department for evaluation of abdominal pain.  Patient states that she has been experiencing left-sided abdominal pain with intermittent nausea/vomiting for the past 2 days.  She states that the pain is 10/10, predominantly on the left side, and endorses difficulty finding a comfortable position.  Reports radiation of pain into her back as well.  Denies fever/chills, chest pain, shortness of breath, diarrhea, urinary symptoms, rashes, or dizziness/lightheadedness. ? ? ?History Limitations: No limitations. ? ?    ? ? ?Physical Exam  ?Triage Vital Signs: ?ED Triage Vitals  ?Enc Vitals Group  ?   BP 12/04/21 0937 (!) 208/84  ?   Pulse Rate 12/04/21 0937 63  ?   Resp 12/04/21 0937 20  ?   Temp 12/04/21 0937 97.8 ?F (36.6 ?C)  ?   Temp Source 12/04/21 0937 Oral  ?   SpO2 12/04/21 0937 97 %  ?   Weight 12/04/21 0938 156 lb 12 oz (71.1 kg)  ?   Height 12/04/21 0938 5\' 7"  (1.702 m)  ?   Head Circumference --   ?   Peak Flow --   ?   Pain Score 12/04/21 0938 10  ?   Pain Loc --   ?   Pain Edu? --   ?   Excl. in GC? --   ? ? ?Most recent vital signs: ?Vitals:  ? 12/04/21 0937 12/04/21 1303  ?BP: (!) 208/84 (!) 152/87  ?Pulse: 63 60  ?Resp: 20 16  ?Temp: 97.8 ?F (36.6 ?C)   ?SpO2: 97% 98%  ? ? ?General: Awake, appears uncomfortable and in pain. ?Skin: Warm, dry. No rashes or lesions.  ?Eyes: PERRL. Conjunctivae normal.  ?CV: Good peripheral perfusion.  ?Resp: Normal effort. ?Abd: Soft, non-tender. No distention.  ?Neuro: At baseline. No gross neurological deficits.  ? ?Focused Exam: Mild tenderness when palpating the abdomen, particularly along the left flank.  Positive CVA  tenderness. ? ?Physical Exam ? ? ? ?ED Results / Procedures / Treatments  ?Labs ?(all labs ordered are listed, but only abnormal results are displayed) ?Labs Reviewed  ?COMPREHENSIVE METABOLIC PANEL - Abnormal; Notable for the following components:  ?    Result Value  ? Glucose, Bld 118 (*)   ? All other components within normal limits  ?URINALYSIS, ROUTINE W REFLEX MICROSCOPIC - Abnormal; Notable for the following components:  ? Color, Urine YELLOW (*)   ? APPearance HAZY (*)   ? Specific Gravity, Urine 1.035 (*)   ? Hgb urine dipstick LARGE (*)   ? Protein, ur 30 (*)   ? RBC / HPF >50 (*)   ? Bacteria, UA FEW (*)   ? All other components within normal limits  ?LIPASE, BLOOD  ?CBC  ?POC URINE PREG, ED  ? ? ? ?EKG ?Not applicable. ? ? ?RADIOLOGY ? ?ED Provider Interpretation: I personally reviewed and interpreted the CT, findings suggestive ureteral calculus on the left side. ? ?CT Abdomen Pelvis W Contrast ? ?Result Date: 12/04/2021 ?CLINICAL DATA:  A 61 year old female presents for evaluation of abdominal pain, acute nonlocalized abdominal pain. EXAM: CT ABDOMEN AND PELVIS WITH CONTRAST TECHNIQUE: Multidetector CT imaging of the abdomen and pelvis was  performed using the standard protocol following bolus administration of intravenous contrast. RADIATION DOSE REDUCTION: This exam was performed according to the departmental dose-optimization program which includes automated exposure control, adjustment of the mA and/or kV according to patient size and/or use of iterative reconstruction technique. CONTRAST:  80mL OMNIPAQUE IOHEXOL 300 MG/ML  SOLN COMPARISON:  None. FINDINGS: Lower chest: Basilar atelectasis. No effusion or sign of consolidation. Hepatobiliary: No focal, suspicious hepatic lesion. No pericholecystic stranding. No biliary duct dilation. Portal vein is patent. Mild background hepatic steatosis. Pancreas: Normal, without mass, inflammation or ductal dilatation. Spleen: Normal. Adrenals/Urinary Tract:  Adrenal glands are normal. Mild/developing hydronephrosis on the LEFT due to a mid LEFT ureteral calculus measuring 4 mm. Lower pole calculus also on the LEFT measuring 3-4 mm. Moderate LEFT perinephric stranding. Delayed LEFT renal enhancement as compared to the RIGHT. Normal appearance of the RIGHT kidney. No suspicious renal lesion. Urinary bladder decompressed limiting assessment. Stomach/Bowel: Small hiatal hernia. No perigastric stranding. No small bowel dilation. Appendix is normal. Mild sigmoid diverticulosis. Vascular/Lymphatic: Aortic atherosclerosis. No sign of aneurysm. Smooth contour of the IVC. There is no gastrohepatic or hepatoduodenal ligament lymphadenopathy. No retroperitoneal or mesenteric lymphadenopathy. No pelvic sidewall lymphadenopathy. Atherosclerotic changes are both calcified and noncalcified and are moderate Reproductive: Unremarkable by CT. Other: No ascites or pneumoperitoneum. Musculoskeletal: No acute bone finding. No destructive bone process. Spinal degenerative changes. IMPRESSION: 1. Signs of LEFT ureteral obstruction with mild LEFT hydronephrosis and delayed enhancement and renal excretion with moderate perinephric stranding due to a mid LEFT ureteral calculus. 2. Lower pole calculus also on the LEFT measuring 3-4 mm. 3. Small hiatal hernia. 4. Mild sigmoid diverticulosis without evidence of acute diverticulitis. 5. Mild background hepatic steatosis. 6. Aortic atherosclerosis. Aortic Atherosclerosis (ICD10-I70.0). Electronically Signed   By: Donzetta KohutGeoffrey  Wile M.D.   On: 12/04/2021 10:48   ? ?PROCEDURES: ? ?Critical Care performed: None. ? ?Procedures ? ? ? ?MEDICATIONS ORDERED IN ED: ?Medications  ?prochlorperazine (COMPAZINE) injection 10 mg (10 mg Intravenous Given 12/04/21 1047)  ?sodium chloride 0.9 % bolus 1,000 mL (0 mLs Intravenous Stopped 12/04/21 1302)  ?ondansetron (ZOFRAN) injection 4 mg (4 mg Intravenous Given 12/04/21 1009)  ?HYDROmorphone (DILAUDID) injection 1 mg (1 mg  Intravenous Given 12/04/21 1009)  ?iohexol (OMNIPAQUE) 300 MG/ML solution 80 mL (80 mLs Intravenous Contrast Given 12/04/21 1030)  ?fentaNYL (SUBLIMAZE) injection 50 mcg (50 mcg Intravenous Given 12/04/21 1047)  ?ketorolac (TORADOL) 15 MG/ML injection 15 mg (15 mg Intravenous Given 12/04/21 1114)  ? ? ? ?IMPRESSION / MDM / ASSESSMENT AND PLAN / ED COURSE  ?I reviewed the triage vital signs and the nursing notes. ?             ?               ? ?Differential diagnosis includes, but is not limited to, nephrolithiasis, pyelonephritis, appendicitis, cholelithiasis, cholangitis, diverticulitis, gastroenteritis. ? ?ED Course ?Patient appears uncomfortable and in pain.  Vitals are within normal limits.  We will go ahead initiate IV fluids, ondansetron, and hydromorphone. ? ?CBC shows no leukocytosis or anemia.  Lipase unremarkable at 39.  CMP shows no evidence of transaminitis, kidney injury, electrolyte abnormalities. ? ?Urinalysis shows no evidence of urinary tract infection, though does show large hemoglobin. ? ?Upon reexamination, patient states that she is still nauseous and in pain, we will go ahead and treat with fentanyl and ketorolac, as well as Compazine. ? ?CT abdomen pelvis shows evidence of 4 mm left ureteral calculus with mild hydronephrosis. ? ?On  final examination, patient states that her pain has mostly resolved. ? ?Assessment/Plan ?Presentation consistent with nephrolithiasis.  CT confirms 4 mm left ureteral calculus with mild hydronephrosis.  No evidence of infection on urinalysis.  CT abdomen reassuring for no evidence of any other acute abdominal/pelvic pathology.  Patient's pain is controlled at this time.  We will plan to discharge this patient with oxycodone/acetaminophen, ondansetron, and tamsulosin.  We will additionally provide her with a referral to urology. ? ?Considered admission for this patient, but given the patient's stable vitals, improving symptoms, she is unlikely to benefit from  admission. ? ?Provided the patient with anticipatory guidance, return precautions, and educational material. Encouraged the patient to return to the emergency department at any time if they begin to experience any new or worsenin

## 2021-12-04 NOTE — Discharge Instructions (Addendum)
-  Treat pain with ibuprofen and oxycodone/acetaminophen as needed.  He may utilize ondansetron as needed for nausea. ?-Please take tamsulosin daily to help facilitate the passage of the stone. ?-Follow-up with the urologist listed above to schedule an appointment as needed. ?

## 2021-12-05 ENCOUNTER — Other Ambulatory Visit: Payer: Self-pay

## 2021-12-05 ENCOUNTER — Other Ambulatory Visit: Payer: Medicaid Other

## 2021-12-06 ENCOUNTER — Other Ambulatory Visit: Payer: Self-pay

## 2021-12-12 ENCOUNTER — Ambulatory Visit: Payer: Medicaid Other | Admitting: Gerontology

## 2021-12-12 ENCOUNTER — Other Ambulatory Visit: Payer: Medicaid Other

## 2021-12-19 ENCOUNTER — Other Ambulatory Visit: Payer: Medicaid Other

## 2021-12-19 ENCOUNTER — Ambulatory Visit: Payer: Medicaid Other | Admitting: Gerontology

## 2021-12-19 ENCOUNTER — Other Ambulatory Visit: Payer: Self-pay

## 2021-12-24 ENCOUNTER — Ambulatory Visit: Payer: Medicaid Other | Admitting: Gerontology

## 2021-12-24 ENCOUNTER — Encounter: Payer: Self-pay | Admitting: Gerontology

## 2021-12-24 ENCOUNTER — Other Ambulatory Visit: Payer: Self-pay

## 2021-12-24 VITALS — BP 128/82 | HR 71 | Temp 97.4°F | Resp 16 | Ht 67.0 in | Wt 153.2 lb

## 2021-12-24 DIAGNOSIS — Z7689 Persons encountering health services in other specified circumstances: Secondary | ICD-10-CM

## 2021-12-24 DIAGNOSIS — Z8673 Personal history of transient ischemic attack (TIA), and cerebral infarction without residual deficits: Secondary | ICD-10-CM

## 2021-12-24 DIAGNOSIS — R0981 Nasal congestion: Secondary | ICD-10-CM

## 2021-12-24 MED ORDER — CLOPIDOGREL BISULFATE 75 MG PO TABS
75.0000 mg | ORAL_TABLET | Freq: Every day | ORAL | 2 refills | Status: DC
  Filled 2021-12-24: qty 30, 30d supply, fill #0

## 2021-12-24 MED ORDER — FLUTICASONE PROPIONATE 50 MCG/ACT NA SUSP
1.0000 | Freq: Every day | NASAL | 0 refills | Status: DC
  Filled 2021-12-24: qty 16, 60d supply, fill #0

## 2021-12-24 MED ORDER — SALINE SPRAY 0.65 % NA SOLN
1.0000 | NASAL | 0 refills | Status: DC | PRN
  Filled 2021-12-24: qty 30, fill #0

## 2021-12-24 NOTE — Progress Notes (Signed)
? ?Established Patient Office Visit ? ?Subjective   ?Patient ID: Desiree Sexton, female    DOB: 10/09/1960  Age: 61 y.o. MRN: [id number] ? ?Chief Complaint  ?Patient presents with  ? Follow-up  ? Ear Fullness  ?  Patient c/o right ear fullness x ~1 week  ? ? ?HPI ? ?Desiree Sexton is a 61 y/o female who has history of Stroke, hypertension, hyperlipidemia, presents for follow up visit. She was seen at the ED on 12/04/21 for abdominal pain, CT scan done showed  1. Signs of LEFT ureteral obstruction with mild LEFT hydronephrosis and delayed enhancement and renal excretion with moderate ?perinephric stranding due to a mid LEFT ureteral calculus. 2. Lower pole calculus also on the LEFT measuring 3-4 mm. 3. Small hiatal hernia. 4. Mild sigmoid diverticulosis without evidence of acute diverticulitis. 5. Mild background hepatic steatosis. 6. Aortic atherosclerosis.Currently, she denies flank pain, dysuria, urinary frequency/urgency. She states that she's compliant with her medications, denies side effects and continues to make healthy lifestyle changes, though she continues to smoke 1/2 pack of cigarette weekly. And admits the desire to quit. Currently, she c/o feeling of fullness to her right ear, denies otalgia, discharge and tinnitus, rhinorrhea, fever, chills, but admits to nasal congestion and sinus pressure.  Overall, she states that she is doing well and offers no further complaints ? ? ? ?Review of Systems  ?Constitutional: Negative.   ?HENT:  Positive for congestion (nasal congestion). Negative for ear discharge, ear pain, sinus pain and tinnitus.   ?     Right ear fullness  ?Respiratory: Negative.    ?Cardiovascular: Negative.   ?Neurological: Negative.   ?Endo/Heme/Allergies: Negative.   ?Psychiatric/Behavioral: Negative.    ? ?  ?Objective:  ?  ? ?BP 128/82 (BP Location: Right Arm, Patient Position: Sitting, Cuff Size: Normal)   Pulse 71   Temp (!) 97.4 ?F (36.3 ?C) (Oral)   Resp 16   Ht 5\' 7"  (1.702 m)    Wt 153 lb 3.2 oz (69.5 kg)   SpO2 98%   BMI 23.99 kg/m?  ?BP Readings from Last 3 Encounters:  ?12/24/21 128/82  ?12/04/21 (!) 152/87  ?11/12/21 116/67  ? ?Wt Readings from Last 3 Encounters:  ?12/24/21 153 lb 3.2 oz (69.5 kg)  ?12/04/21 156 lb 12 oz (71.1 kg)  ?11/12/21 156 lb 11.2 oz (71.1 kg)  ? ?  ? ?Physical Exam ?HENT:  ?   Head: Normocephalic and atraumatic.  ?   Right Ear: Hearing, tympanic membrane, ear canal and external ear normal. No tenderness. No middle ear effusion. There is no impacted cerumen. No mastoid tenderness.  ?   Left Ear: Hearing, tympanic membrane, ear canal and external ear normal. No tenderness.  No middle ear effusion. There is no impacted cerumen. No mastoid tenderness.  ?   Nose:  ?   Right Sinus: Maxillary sinus tenderness present.  ?   Left Sinus: Maxillary sinus tenderness present.  ?   Mouth/Throat:  ?   Mouth: Mucous membranes are moist.  ?Eyes:  ?   Extraocular Movements: Extraocular movements intact.  ?   Conjunctiva/sclera: Conjunctivae normal.  ?   Pupils: Pupils are equal, round, and reactive to light.  ?Cardiovascular:  ?   Rate and Rhythm: Normal rate and regular rhythm.  ?   Pulses: Normal pulses.  ?   Heart sounds: Normal heart sounds.  ?Pulmonary:  ?   Effort: Pulmonary effort is normal.  ?   Breath sounds: Normal breath sounds.  ?Skin: ?  General: Skin is warm.  ?Neurological:  ?   General: No focal deficit present.  ?   Mental Status: She is alert and oriented to person, place, and time. Mental status is at baseline.  ?Psychiatric:     ?   Mood and Affect: Mood normal.     ?   Behavior: Behavior normal.     ?   Thought Content: Thought content normal.     ?   Judgment: Judgment normal.  ? ? ? ?No results found for any visits on 12/24/21. ? ?Last CBC ?Lab Results  ?Component Value Date  ? WBC 6.7 12/04/2021  ? HGB 14.6 12/04/2021  ? HCT 43.0 12/04/2021  ? MCV 90.7 12/04/2021  ? MCH 30.8 12/04/2021  ? RDW 11.9 12/04/2021  ? PLT 179 12/04/2021  ? ?Last metabolic  panel ?Lab Results  ?Component Value Date  ? GLUCOSE 118 (H) 12/04/2021  ? NA 138 12/04/2021  ? K 3.6 12/04/2021  ? CL 105 12/04/2021  ? CO2 26 12/04/2021  ? BUN 15 12/04/2021  ? CREATININE 0.98 12/04/2021  ? GFRNONAA >60 12/04/2021  ? CALCIUM 9.5 12/04/2021  ? PROT 7.7 12/04/2021  ? ALBUMIN 4.6 12/04/2021  ? BILITOT 0.9 12/04/2021  ? ALKPHOS 112 12/04/2021  ? AST 21 12/04/2021  ? ALT 20 12/04/2021  ? ANIONGAP 7 12/04/2021  ? ?Last lipids ?Lab Results  ?Component Value Date  ? CHOL 159 06/08/2020  ? HDL 33 (L) 06/08/2020  ? LDLCALC 105 (H) 06/08/2020  ? TRIG 105 06/08/2020  ? CHOLHDL 4.8 06/08/2020  ? ?Last hemoglobin A1c ?Lab Results  ?Component Value Date  ? HGBA1C 5.2 06/08/2020  ? ?Last vitamin D ?No results found for: 25OHVITD2, 25OHVITD3, VD25OH ?  ? ?The ASCVD Risk score (Arnett DK, et al., 2019) failed to calculate for the following reasons: ?  The patient has a prior MI or stroke diagnosis ? ?  ?Assessment & Plan:  ? ?1. History of stroke ?-She will continue current medication, was advised to notify clinic and go to the ED for hematuria, hematochezia and active bleeding. ?- clopidogrel (PLAVIX) 75 MG tablet; Take 1 tablet (75 mg total) by mouth once daily.  Dispense: 30 tablet; Refill: 2 ? ?2. Nasal congestion ?-Ear within normal limits, she was started on nasal saline spray and Flonase for possible nasal congestion, was educated on medication side effects and advised to notify clinic. ?- sodium chloride (OCEAN) 0.65 % SOLN nasal spray; Place 1 spray into both nostrils as needed for congestion.  Dispense: 30 mL; Refill: 0 ?- fluticasone (FLONASE) 50 MCG/ACT nasal spray; Place 1 spray into both nostrils daily.  Dispense: 16 g; Refill: 0 ? ?3. Encounter to establish care ?-Routine labs will be checked.  She was provided with [facility] flyer and advised to call and schedule mammogram and Pap smear. ?- Vitamin D (25 hydroxy) ?- HgB A1c ?- Lipid panel ? ? ?Return in about 3 months (around 04/03/2022), or if symptoms  worsen or fail to improve.  ? ? ?Patric Buckhalter Trellis Paganini, NP ? ?

## 2021-12-24 NOTE — Patient Instructions (Signed)

## 2021-12-25 ENCOUNTER — Other Ambulatory Visit: Payer: Medicaid Other

## 2021-12-25 ENCOUNTER — Other Ambulatory Visit: Payer: Self-pay

## 2021-12-25 ENCOUNTER — Other Ambulatory Visit: Payer: Self-pay | Admitting: Gerontology

## 2021-12-25 DIAGNOSIS — E559 Vitamin D deficiency, unspecified: Secondary | ICD-10-CM

## 2021-12-25 LAB — LIPID PANEL
Chol/HDL Ratio: 3.5 ratio (ref 0.0–4.4)
Cholesterol, Total: 160 mg/dL (ref 100–199)
HDL: 46 mg/dL (ref >39)
LDL Chol Calc (NIH): 95 mg/dL (ref 0–99)
Triglycerides: 101 mg/dL (ref 0–149)
VLDL Cholesterol Cal: 19 mg/dL (ref 5–40)

## 2021-12-25 LAB — HEMOGLOBIN A1C
Est. average glucose Bld gHb Est-mCnc: 108 mg/dL
Hgb A1c MFr Bld: 5.4 % (ref 4.8–5.6)

## 2021-12-25 LAB — VITAMIN D 25 HYDROXY (VIT D DEFICIENCY, FRACTURES): Vit D, 25-Hydroxy: 7.7 ng/mL — ABNORMAL LOW (ref 30.0–100.0)

## 2021-12-25 MED ORDER — VITAMIN D (ERGOCALCIFEROL) 1.25 MG (50000 UNIT) PO CAPS
50000.0000 [IU] | ORAL_CAPSULE | ORAL | 0 refills | Status: DC
  Filled 2021-12-25 – 2022-01-20 (×2): qty 4, 28d supply, fill #0
  Filled 2022-02-26: qty 4, 28d supply, fill #1

## 2022-01-08 ENCOUNTER — Other Ambulatory Visit: Payer: Self-pay

## 2022-01-20 ENCOUNTER — Other Ambulatory Visit: Payer: Self-pay
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
## 2022-02-25 ENCOUNTER — Encounter: Payer: Self-pay | Admitting: Family Medicine

## 2022-02-25 ENCOUNTER — Ambulatory Visit: Payer: Self-pay | Admitting: Family Medicine

## 2022-02-25 DIAGNOSIS — A599 Trichomoniasis, unspecified: Secondary | ICD-10-CM

## 2022-02-25 DIAGNOSIS — Z113 Encounter for screening for infections with a predominantly sexual mode of transmission: Secondary | ICD-10-CM

## 2022-02-25 LAB — WET PREP FOR TRICH, YEAST, CLUE
Trichomonas Exam: POSITIVE — AB
Yeast Exam: NEGATIVE

## 2022-02-25 MED ORDER — METRONIDAZOLE 500 MG PO TABS: 500.0000 mg | ORAL_TABLET | Freq: Two times a day (BID) | ORAL | 0 refills | Status: DC

## 2022-02-25 MED ORDER — METRONIDAZOLE 500 MG PO TABS: 500.0000 mg | ORAL_TABLET | Freq: Two times a day (BID) | ORAL | 0 refills | Status: AC

## 2022-02-25 NOTE — Progress Notes (Unsigned)
Pt here for STI screening.  Wet mount reviewed.  Metronidazole 500mg  #14 dispensed as per SO.  Education provided and pt given opportunity to ask questions and voice concerns.  Verbalizes understanding of med regimen, side effects and when to contact clinic if concerns.  Condoms declined. , RN.
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
## 2022-02-26 ENCOUNTER — Other Ambulatory Visit: Payer: Self-pay

## 2022-04-03 ENCOUNTER — Ambulatory Visit: Payer: Medicaid Other | Admitting: Gerontology

## 2022-04-10 ENCOUNTER — Ambulatory Visit: Payer: Medicaid Other | Admitting: Gerontology

## 2022-04-29 ENCOUNTER — Ambulatory Visit: Payer: Medicaid Other | Admitting: Gerontology

## 2022-05-01 ENCOUNTER — Ambulatory Visit: Payer: Medicaid Other | Admitting: Gerontology

## 2022-05-13 ENCOUNTER — Other Ambulatory Visit: Payer: Self-pay

## 2022-05-13 ENCOUNTER — Encounter: Payer: Self-pay | Admitting: Gerontology

## 2022-05-13 ENCOUNTER — Ambulatory Visit: Payer: Medicaid Other | Admitting: Gerontology

## 2022-05-13 VITALS — BP 133/72 | HR 75 | Temp 97.6°F | Resp 16 | Ht 67.0 in | Wt 153.0 lb

## 2022-05-13 DIAGNOSIS — R208 Other disturbances of skin sensation: Secondary | ICD-10-CM

## 2022-05-13 DIAGNOSIS — M549 Dorsalgia, unspecified: Secondary | ICD-10-CM

## 2022-05-13 DIAGNOSIS — R4589 Other symptoms and signs involving emotional state: Secondary | ICD-10-CM

## 2022-05-13 DIAGNOSIS — Z720 Tobacco use: Secondary | ICD-10-CM

## 2022-05-13 DIAGNOSIS — K5909 Other constipation: Secondary | ICD-10-CM

## 2022-05-13 DIAGNOSIS — E559 Vitamin D deficiency, unspecified: Secondary | ICD-10-CM

## 2022-05-13 MED ORDER — LIDOCAINE 4 % EX PTCH
1.0000 | MEDICATED_PATCH | CUTANEOUS | 0 refills | Status: DC
  Filled 2022-05-13: qty 30, 30d supply, fill #0

## 2022-05-13 MED ORDER — DOCUSATE SODIUM 100 MG PO CAPS
100.0000 mg | ORAL_CAPSULE | Freq: Every day | ORAL | 0 refills | Status: DC
  Filled 2022-05-13: qty 30, 30d supply, fill #0

## 2022-05-13 NOTE — Progress Notes (Signed)
Established Patient Office Visit  Subjective   Patient ID: Desiree Sexton, female    DOB: 17-Jul-1961  Age: 61 y.o. MRN: [id number]  Chief Complaint  Patient presents with   Follow-up    Labs drawn 12/24/21. Patient states she has not taken any of her medications x 1 month.    HPI  Desiree Sexton is a 61 y/o female who has a history of stroke, hypertension, hyperlipidemia, presents for follow up visit. She states that she stopped taking all of her medications approximately one month ago due to feelings of nausea for the past 5-6 months. She states that the nausea did not improve after she stopped taking her medications.  She denies vomiting. She also states that she has been constipated for the past 5-6 months as well. She states she can go a week or two without having a bowel movement. Her last bowel movement was two days ago; it was soft and no blood was present. She states that it seems that her bowels move better after being active and walking. She has never had a colonoscopy. She is concerned, as her mother had a tumor on her colon that was discovered after a colonoscopy. She often experiences indigestion, reflux and heartburn. She experiences intermittent sharp pains in her RUQ and LUQ; this pain occurs most often with stretching or straining the muscles of her chest wall. She states that she is often fatigued and that taking Vitamin D did not relieve this symptom. She suffers from chronic pain in her neck and mid-back. Today her pain is an 8/10 in her back. She also intermittently feels a pain in her lower back that shoots down her right leg. She denies bladder/bowel incontinence and saddle anesthesia. She states that the soles of her feet also intermittently burn and tingle. On her visit to the health department on 02/25/22, she was diagnosed with trichomonas and prescribed Flagyl. She states that she took the Flagyl as ordered and has had no vaginal symptoms since finishing the course of  medications. She also has c/o sinus drainage and congestion and has been taking OTC Flonase for relief. She denies fever and cough. She is also applying for SSI and disability, as she is still experiencing balance issues, memory issues, and is not able to drive since her stroke. Her mood is dysphoric, but she does not express suicidal or homicidal ideations.   Review of Systems  Constitutional:  Positive for malaise/fatigue.  HENT:  Positive for congestion.   Eyes: Negative.   Respiratory: Negative.    Cardiovascular: Negative.   Gastrointestinal:  Positive for constipation.  Genitourinary: Negative.   Musculoskeletal:  Positive for back pain (chronic) and neck pain (chronic).  Neurological:  Positive for tingling ("burning sensation" in the soles of her feet.).  Psychiatric/Behavioral:  Positive for depression, memory loss (chronic from CVA) and substance abuse. The patient is nervous/anxious and has insomnia.       Objective:     BP 133/72 (BP Location: Right Arm, Patient Position: Sitting, Cuff Size: Large)   Pulse 75   Temp 97.6 F (36.4 C) (Oral)   Resp 16   Ht 5\' 7"  (1.702 m)   Wt 153 lb (69.4 kg)   SpO2 93%   BMI 23.96 kg/m  BP Readings from Last 3 Encounters:  05/13/22 133/72  12/24/21 128/82  12/04/21 (!) 152/87   Wt Readings from Last 3 Encounters:  05/13/22 153 lb (69.4 kg)  12/24/21 153 lb 3.2 oz (69.5 kg)  12/04/21 156  lb 12 oz (71.1 kg)      Physical Exam Constitutional:      Appearance: Normal appearance.  HENT:     Head: Normocephalic and atraumatic.  Eyes:     Conjunctiva/sclera: Conjunctivae normal.     Pupils: Pupils are equal, round, and reactive to light.  Cardiovascular:     Rate and Rhythm: Normal rate and regular rhythm.     Heart sounds: Normal heart sounds.  Pulmonary:     Effort: Pulmonary effort is normal.     Breath sounds: Normal breath sounds.  Abdominal:     General: Abdomen is flat. Bowel sounds are normal.     Palpations:  Abdomen is soft. There is no mass.     Tenderness: There is abdominal tenderness (tenderness on palpation in all 4 quadrants). There is no right CVA tenderness, left CVA tenderness or guarding.  Musculoskeletal:     Cervical back: No tenderness.     Comments: Generalized muscle weakness  Skin:    General: Skin is warm and dry.  Neurological:     General: No focal deficit present.     Mental Status: She is alert and oriented to person, place, and time. Mental status is at baseline.  Psychiatric:        Behavior: Behavior normal.        Thought Content: Thought content normal.        Judgment: Judgment normal.     Comments: Tearful      No results found for any visits on 05/13/22.  Last CBC Lab Results  Component Value Date   WBC 6.7 12/04/2021   HGB 14.6 12/04/2021   HCT 43.0 12/04/2021   MCV 90.7 12/04/2021   MCH 30.8 12/04/2021   RDW 11.9 12/04/2021   PLT 179 [id number]   Last metabolic panel Lab Results  Component Value Date   GLUCOSE 118 (H) 12/04/2021   NA 138 12/04/2021   K 3.6 12/04/2021   CL 105 12/04/2021   CO2 26 12/04/2021   BUN 15 12/04/2021   CREATININE 0.98 12/04/2021   GFRNONAA >60 12/04/2021   CALCIUM 9.5 12/04/2021   PROT 7.7 12/04/2021   ALBUMIN 4.6 12/04/2021   LABGLOB 2.5 12/26/2017   AGRATIO 1.8 12/26/2017   BILITOT 0.9 12/04/2021   ALKPHOS 112 12/04/2021   AST 21 12/04/2021   ALT 20 12/04/2021   ANIONGAP 7 12/04/2021   Last lipids Lab Results  Component Value Date   CHOL 160 12/24/2021   HDL 46 12/24/2021   LDLCALC 95 12/24/2021   TRIG 101 12/24/2021   CHOLHDL 3.5 12/24/2021   Last hemoglobin A1c Lab Results  Component Value Date   HGBA1C 5.4 12/24/2021   Last vitamin D Lab Results  Component Value Date   VD25OH 7.7 (L) 12/24/2021      The ASCVD Risk score (Arnett DK, et al., 2019) failed to calculate for the following reasons:   The patient has a prior MI or stroke diagnosis    Assessment & Plan:     Chronic  constipation - Educated the patient to exercise as tolerated, increase fiber, and increase water intake. Referral to GI made for colonoscopy. Encouraged her to fill out a [facility] financial application and return it tomorrow. Tobacco abuse - Encouraged smoking cessation.  Chronic mid back pain - Lidocaine patches ordered to be applied daily PRN to assist with back pain. Encouraged her to take Tylenol 500 mg PRN for pain. An appointment will be scheduled with Dr. Jefm Bryant to  further assess her musculoskeletal issues. A UA with reflex culture will be drawn in clinic tomorrow to rule out kidney/urinary infection that could potentially contribute to mid back pain.  Dysphoric mood - Discussed her feelings of depression, anxiety, and insomnia. An appointment will be made with [facility] to further evaluate and treat her mental health. Encouraged the patient to call the crisis line in case of emergency.  Burning sensation of feet - This condition will be addressed further in a future visit after she has resumed her medication regimen and is tolerating her current medications with no adverse effects.She was advised to go to the ED for worsening symptoms.  Vitamin D deficiency - Vitamin D to be redrawn tomorrow to reassess deficiency status now that she has finished her Vitamin D prescription. Encouraged the patient to take OTC Vitamin D to prevent future deficiency.  Return in about 1 month (around 06/12/2022), or if symptoms worsen or fail to improve.    Odelia Gage

## 2022-05-13 NOTE — Patient Instructions (Signed)

## 2022-05-14 ENCOUNTER — Other Ambulatory Visit: Payer: Self-pay

## 2022-05-14 ENCOUNTER — Other Ambulatory Visit: Payer: Medicaid Other

## 2022-05-16 LAB — CBC WITH DIFFERENTIAL/PLATELET
Basophils Absolute: 0.1 10*3/uL (ref 0.0–0.2)
Basos: 1 %
EOS (ABSOLUTE): 0.1 10*3/uL (ref 0.0–0.4)
Eos: 1 %
Hematocrit: 38.2 % (ref 34.0–46.6)
Hemoglobin: 13.6 g/dL (ref 11.1–15.9)
Immature Grans (Abs): 0 10*3/uL (ref 0.0–0.1)
Immature Granulocytes: 0 %
Lymphocytes Absolute: 2.9 10*3/uL (ref 0.7–3.1)
Lymphs: 52 %
MCH: 31.6 pg (ref 26.6–33.0)
MCHC: 35.6 g/dL (ref 31.5–35.7)
MCV: 89 fL (ref 79–97)
Monocytes Absolute: 0.4 10*3/uL (ref 0.1–0.9)
Monocytes: 7 %
Neutrophils Absolute: 2.2 10*3/uL (ref 1.4–7.0)
Neutrophils: 39 %
Platelets: 192 10*3/uL (ref 150–450)
RBC: 4.3 x10E6/uL (ref 3.77–5.28)
RDW: 11.8 % (ref 11.7–15.4)
WBC: 5.7 10*3/uL (ref 3.4–10.8)

## 2022-05-16 LAB — COMPREHENSIVE METABOLIC PANEL
ALT: 13 IU/L (ref 0–32)
AST: 14 IU/L (ref 0–40)
Albumin/Globulin Ratio: 3.8 — ABNORMAL HIGH (ref 1.2–2.2)
Albumin: 5 g/dL — ABNORMAL HIGH (ref 3.8–4.9)
Alkaline Phosphatase: 132 IU/L — ABNORMAL HIGH (ref 44–121)
BUN/Creatinine Ratio: 17 (ref 12–28)
BUN: 16 mg/dL (ref 8–27)
Bilirubin Total: 0.3 mg/dL (ref 0.0–1.2)
CO2: 24 mmol/L (ref 20–29)
Calcium: 9.2 mg/dL (ref 8.7–10.3)
Chloride: 103 mmol/L (ref 96–106)
Creatinine, Ser: 0.95 mg/dL (ref 0.57–1.00)
Globulin, Total: 1.3 g/dL — ABNORMAL LOW (ref 1.5–4.5)
Glucose: 98 mg/dL (ref 70–99)
Potassium: 4.2 mmol/L (ref 3.5–5.2)
Sodium: 145 mmol/L — ABNORMAL HIGH (ref 134–144)
Total Protein: 6.3 g/dL (ref 6.0–8.5)
eGFR: 69 mL/min/{1.73_m2} (ref >59)

## 2022-05-16 LAB — URINALYSIS

## 2022-05-16 LAB — VITAMIN D 25 HYDROXY (VIT D DEFICIENCY, FRACTURES): Vit D, 25-Hydroxy: 24.8 ng/mL — ABNORMAL LOW (ref 30.0–100.0)

## 2022-05-20 ENCOUNTER — Ambulatory Visit: Payer: Medicaid Other | Admitting: Gerontology

## 2022-05-21 ENCOUNTER — Institutional Professional Consult (permissible substitution): Payer: Self-pay | Admitting: Licensed Clinical Social Worker

## 2022-06-12 ENCOUNTER — Ambulatory Visit: Payer: Self-pay | Admitting: Gerontology

## 2022-06-24 ENCOUNTER — Ambulatory Visit: Payer: Self-pay | Admitting: Rheumatology

## 2022-08-28 ENCOUNTER — Other Ambulatory Visit: Payer: Self-pay | Admitting: *Deleted

## 2022-08-28 DIAGNOSIS — M549 Dorsalgia, unspecified: Secondary | ICD-10-CM

## 2022-09-10 ENCOUNTER — Ambulatory Visit
Admission: RE | Admit: 2022-09-10 | Discharge: 2022-09-10 | Disposition: A | Discharge status: Home / Self Care | Payer: Disability Insurance | Source: Ambulatory Visit | Attending: *Deleted | Admitting: *Deleted

## 2022-09-10 ENCOUNTER — Ambulatory Visit
Admission: RE | Admit: 2022-09-10 | Discharge: 2022-09-10 | Disposition: A | Discharge status: Home / Self Care | Payer: Disability Insurance | Attending: Diagnostic Radiology | Admitting: Diagnostic Radiology

## 2022-09-10 DIAGNOSIS — M549 Dorsalgia, unspecified: Secondary | ICD-10-CM | POA: Diagnosis present

## 2022-09-10 DIAGNOSIS — M4856XA Collapsed vertebra, not elsewhere classified, lumbar region, initial encounter for fracture: Secondary | ICD-10-CM | POA: Insufficient documentation

## 2022-09-10 DIAGNOSIS — G8929 Other chronic pain: Secondary | ICD-10-CM | POA: Diagnosis not present

## 2022-09-22 ENCOUNTER — Other Ambulatory Visit: Payer: Self-pay

## 2022-09-22 DIAGNOSIS — Z124 Encounter for screening for malignant neoplasm of cervix: Secondary | ICD-10-CM | POA: Diagnosis not present

## 2022-09-22 DIAGNOSIS — I639 Cerebral infarction, unspecified: Secondary | ICD-10-CM | POA: Diagnosis not present

## 2022-09-22 DIAGNOSIS — Z23 Encounter for immunization: Secondary | ICD-10-CM | POA: Diagnosis not present

## 2022-09-22 DIAGNOSIS — Z0000 Encounter for general adult medical examination without abnormal findings: Secondary | ICD-10-CM | POA: Diagnosis not present

## 2022-09-23 ENCOUNTER — Other Ambulatory Visit: Payer: Self-pay | Admitting: Family Medicine

## 2022-09-23 DIAGNOSIS — Z1231 Encounter for screening mammogram for malignant neoplasm of breast: Secondary | ICD-10-CM

## 2022-10-03 ENCOUNTER — Other Ambulatory Visit: Payer: Self-pay

## 2022-10-06 ENCOUNTER — Ambulatory Visit: Payer: Medicaid Other | Admitting: Gastroenterology

## 2022-10-06 ENCOUNTER — Encounter: Payer: Self-pay | Admitting: Gastroenterology

## 2023-01-07 ENCOUNTER — Other Ambulatory Visit: Payer: Self-pay

## 2023-01-15 ENCOUNTER — Other Ambulatory Visit: Payer: Self-pay

## 2023-02-18 ENCOUNTER — Ambulatory Visit: Payer: Self-pay | Admitting: Nurse Practitioner

## 2023-04-21 ENCOUNTER — Other Ambulatory Visit: Payer: Self-pay

## 2023-04-21 ENCOUNTER — Inpatient Hospital Stay
Admission: EM | Admit: 2023-04-21 | Discharge: 2023-05-19 | DRG: 871 | Disposition: E | Payer: Medicare Other | Attending: Pulmonary Disease | Admitting: Pulmonary Disease

## 2023-04-21 ENCOUNTER — Encounter: Payer: Self-pay | Admitting: Emergency Medicine

## 2023-04-21 DIAGNOSIS — A419 Sepsis, unspecified organism: Principal | ICD-10-CM | POA: Insufficient documentation

## 2023-04-21 DIAGNOSIS — N179 Acute kidney failure, unspecified: Secondary | ICD-10-CM | POA: Insufficient documentation

## 2023-04-21 DIAGNOSIS — I469 Cardiac arrest, cause unspecified: Secondary | ICD-10-CM | POA: Insufficient documentation

## 2023-04-21 DIAGNOSIS — Z803 Family history of malignant neoplasm of breast: Secondary | ICD-10-CM

## 2023-04-21 DIAGNOSIS — Z811 Family history of alcohol abuse and dependence: Secondary | ICD-10-CM

## 2023-04-21 DIAGNOSIS — J189 Pneumonia, unspecified organism: Secondary | ICD-10-CM | POA: Diagnosis present

## 2023-04-21 DIAGNOSIS — Z8349 Family history of other endocrine, nutritional and metabolic diseases: Secondary | ICD-10-CM

## 2023-04-21 DIAGNOSIS — Z8249 Family history of ischemic heart disease and other diseases of the circulatory system: Secondary | ICD-10-CM

## 2023-04-21 DIAGNOSIS — R001 Bradycardia, unspecified: Secondary | ICD-10-CM | POA: Diagnosis present

## 2023-04-21 DIAGNOSIS — Z833 Family history of diabetes mellitus: Secondary | ICD-10-CM

## 2023-04-21 DIAGNOSIS — Z5982 Transportation insecurity: Secondary | ICD-10-CM

## 2023-04-21 DIAGNOSIS — R161 Splenomegaly, not elsewhere classified: Secondary | ICD-10-CM | POA: Diagnosis present

## 2023-04-21 DIAGNOSIS — E875 Hyperkalemia: Secondary | ICD-10-CM | POA: Insufficient documentation

## 2023-04-21 DIAGNOSIS — F19929 Other psychoactive substance use, unspecified with intoxication, unspecified: Secondary | ICD-10-CM | POA: Diagnosis present

## 2023-04-21 DIAGNOSIS — Z66 Do not resuscitate: Secondary | ICD-10-CM | POA: Diagnosis not present

## 2023-04-21 DIAGNOSIS — D65 Disseminated intravascular coagulation [defibrination syndrome]: Secondary | ICD-10-CM | POA: Diagnosis present

## 2023-04-21 DIAGNOSIS — E872 Acidosis, unspecified: Secondary | ICD-10-CM | POA: Diagnosis present

## 2023-04-21 DIAGNOSIS — Z1152 Encounter for screening for COVID-19: Secondary | ICD-10-CM

## 2023-04-21 DIAGNOSIS — R6521 Severe sepsis with septic shock: Secondary | ICD-10-CM | POA: Diagnosis present

## 2023-04-21 DIAGNOSIS — D589 Hereditary hemolytic anemia, unspecified: Secondary | ICD-10-CM | POA: Insufficient documentation

## 2023-04-21 DIAGNOSIS — K5730 Diverticulosis of large intestine without perforation or abscess without bleeding: Secondary | ICD-10-CM | POA: Diagnosis present

## 2023-04-21 DIAGNOSIS — G928 Other toxic encephalopathy: Secondary | ICD-10-CM | POA: Insufficient documentation

## 2023-04-21 DIAGNOSIS — T405X1A Poisoning by cocaine, accidental (unintentional), initial encounter: Secondary | ICD-10-CM | POA: Diagnosis present

## 2023-04-21 DIAGNOSIS — Z9911 Dependence on respirator [ventilator] status: Secondary | ICD-10-CM

## 2023-04-21 DIAGNOSIS — D649 Anemia, unspecified: Secondary | ICD-10-CM

## 2023-04-21 DIAGNOSIS — Z800 Family history of malignant neoplasm of digestive organs: Secondary | ICD-10-CM

## 2023-04-21 DIAGNOSIS — I10 Essential (primary) hypertension: Secondary | ICD-10-CM | POA: Diagnosis present

## 2023-04-21 DIAGNOSIS — I468 Cardiac arrest due to other underlying condition: Secondary | ICD-10-CM | POA: Diagnosis present

## 2023-04-21 DIAGNOSIS — Z5941 Food insecurity: Secondary | ICD-10-CM

## 2023-04-21 DIAGNOSIS — J9601 Acute respiratory failure with hypoxia: Secondary | ICD-10-CM | POA: Insufficient documentation

## 2023-04-21 DIAGNOSIS — R591 Generalized enlarged lymph nodes: Secondary | ICD-10-CM | POA: Diagnosis present

## 2023-04-21 DIAGNOSIS — R739 Hyperglycemia, unspecified: Secondary | ICD-10-CM | POA: Insufficient documentation

## 2023-04-21 DIAGNOSIS — E785 Hyperlipidemia, unspecified: Secondary | ICD-10-CM | POA: Diagnosis present

## 2023-04-21 DIAGNOSIS — E8729 Other acidosis: Secondary | ICD-10-CM | POA: Insufficient documentation

## 2023-04-21 DIAGNOSIS — R590 Localized enlarged lymph nodes: Secondary | ICD-10-CM | POA: Diagnosis present

## 2023-04-21 DIAGNOSIS — Y92009 Unspecified place in unspecified non-institutional (private) residence as the place of occurrence of the external cause: Secondary | ICD-10-CM

## 2023-04-21 DIAGNOSIS — K7200 Acute and subacute hepatic failure without coma: Secondary | ICD-10-CM | POA: Insufficient documentation

## 2023-04-21 DIAGNOSIS — F17210 Nicotine dependence, cigarettes, uncomplicated: Secondary | ICD-10-CM | POA: Diagnosis present

## 2023-04-21 DIAGNOSIS — M549 Dorsalgia, unspecified: Secondary | ICD-10-CM | POA: Diagnosis present

## 2023-04-21 DIAGNOSIS — F14129 Cocaine abuse with intoxication, unspecified: Secondary | ICD-10-CM | POA: Diagnosis present

## 2023-04-21 DIAGNOSIS — D696 Thrombocytopenia, unspecified: Secondary | ICD-10-CM | POA: Diagnosis present

## 2023-04-21 DIAGNOSIS — Z8673 Personal history of transient ischemic attack (TIA), and cerebral infarction without residual deficits: Secondary | ICD-10-CM

## 2023-04-21 DIAGNOSIS — Z79899 Other long term (current) drug therapy: Secondary | ICD-10-CM

## 2023-04-21 MED ORDER — LACTATED RINGERS IV BOLUS
1000.0000 mL | Freq: Once | INTRAVENOUS | Status: AC
  Administered 2023-04-21: 1000 mL via INTRAVENOUS

## 2023-04-21 NOTE — ED Provider Notes (Signed)
[facility] Provider Note    Event Date/Time   First MD Initiated Contact with Patient 05/15/2023 2344     (approximate)   History   Generalized Body Aches   HPI  Desiree Sexton is a 62 y.o. female who presents to the ED for evaluation of Generalized Body Aches   Patient presents to the ED for evaluation of diffuse myalgias over the past 2 or 3 hours in the setting of crack cocaine use.  Denies IVDU.  Denies ethanol use, recent illnesses or sick contacts.  No falls, emesis, syncope or focal pain   Physical Exam   Triage Vital Signs: ED Triage Vitals  Encounter Vitals Group     BP 04/27/2023 2328 108/60     Systolic BP Percentile --      Diastolic BP Percentile --      Pulse Rate 05/06/2023 2328 86     Resp 05/12/2023 2328 18     Temp 05/16/2023 2328 98.1 F (36.7 C)     Temp Source 05/13/2023 2328 Oral     SpO2 05/08/2023 2323 100 %     Weight 05/04/2023 2329 173 lb (78.5 kg)     Height 04/20/2023 2329 5\' 7"  (1.702 m)     Head Circumference --      Peak Flow --      Pain Score 05/07/2023 2329 8     Pain Loc --      Pain Education --      Exclude from Growth Chart --     Most recent vital signs: Vitals:   05/08/2023 2323 05/10/2023 2328  BP:  108/60  Pulse:  86  Resp:  18  Temp:  98.1 F (36.7 C)  SpO2: 100% 100%    General:  Resting comfortably with eyes closed when I first into the room.  Awakens to soft vocal stimulation and then starts twitching in bed using all 4 extremities without apparent deficit.  Follows commands in all 4 CV:  Good peripheral perfusion.  Resp:  Normal effort.  Abd:  No distention.  MSK:  No deformity noted.  Neuro:  No focal deficits appreciated. Other:  Notably pale lips   ED Results / Procedures / Treatments   Labs (all labs ordered are listed, but only abnormal results are displayed) Labs Reviewed  BASIC METABOLIC PANEL - Abnormal; Notable for the following components:      Result Value   Glucose, Bld 111 (*)     All other components within normal limits  CK - Abnormal; Notable for the following components:   Total CK 22 (*)    All other components within normal limits  HEPATIC FUNCTION PANEL - Abnormal; Notable for the following components:   Total Bilirubin 4.4 (*)    Bilirubin, Direct 0.6 (*)    Indirect Bilirubin 3.8 (*)    All other components within normal limits  CBC WITH DIFFERENTIAL/PLATELET - Abnormal; Notable for the following components:   Hemoglobin 5.8 (*)    Abs Immature Granulocytes 0.49 (*)    All other components within normal limits  SARS CORONAVIRUS 2 BY RT PCR  CULTURE, BLOOD (ROUTINE X 2)  CULTURE, BLOOD (ROUTINE X 2)  MAGNESIUM  URINALYSIS, ROUTINE W REFLEX MICROSCOPIC  LIPASE, BLOOD  PROTIME-INR  APTT  BRAIN NATRIURETIC PEPTIDE  URINE DRUG SCREEN, QUALITATIVE ([facility] ONLY)  HIV ANTIBODY (ROUTINE TESTING W REFLEX)  LACTIC ACID, PLASMA  LACTIC ACID, PLASMA  PROCALCITONIN  BLOOD GAS, ARTERIAL  CBC  BASIC  METABOLIC PANEL  MAGNESIUM  PHOSPHORUS  HEMOGLOBIN A1C  CBG MONITORING, ED  PREPARE RBC (CROSSMATCH)  TYPE AND SCREEN  ABO/RH  TROPONIN I (HIGH SENSITIVITY)  TROPONIN I (HIGH SENSITIVITY)    EKG Sinus rhythm with a rate of 77 bpm.  Normal axis and intervals.  No clear signs of acute ischemia.  RADIOLOGY 1 view CXR interpreted by me with increased interstitial prominence is bilaterally  Official radiology report(s): DG Chest Portable 1 View  Result Date: 04/20/2023 CLINICAL DATA:  Myalgias, tachypnea. EXAM: PORTABLE CHEST 1 VIEW COMPARISON:  06/07/2020 FINDINGS: Heart and mediastinal contours within normal limits. Aortic atherosclerosis. Interstitial prominence within the lungs. No effusions. No acute bony abnormality. IMPRESSION: Interstitial prominence within the lungs which could reflect interstitial edema or atypical infection. Electronically Signed   By: Charlett Nose M.D.   On: 05/13/2023 01:05    PROCEDURES and INTERVENTIONS:  .1-3 Lead EKG  Interpretation  Performed by: Delton Prairie, MD Authorized by: Delton Prairie, MD     Interpretation: normal     ECG rate:  80   ECG rate assessment: normal     Rhythm: sinus rhythm     Ectopy: none     Conduction: normal   .Critical Care  Performed by: Delton Prairie, MD Authorized by: Delton Prairie, MD   Critical care provider statement:    Critical care time (minutes):  30   Critical care time was exclusive of:  Separately billable procedures and treating other patients   Critical care was necessary to treat or prevent imminent or life-threatening deterioration of the following conditions:  CNS failure or compromise and respiratory failure   Critical care was time spent personally by me on the following activities:  Development of treatment plan with patient or surrogate, discussions with consultants, evaluation of patient's response to treatment, examination of patient, ordering and review of laboratory studies, ordering and review of radiographic studies, ordering and performing treatments and interventions, pulse oximetry, re-evaluation of patient's condition and review of old charts Procedure Name: Intubation Date/Time: 05/17/2023 2:05 AM  Performed by: Delton Prairie, MDPre-anesthesia Checklist: Patient identified, Patient being monitored, Emergency Drugs available, Timeout performed and Suction available Oxygen Delivery Method: Non-rebreather mask Preoxygenation: Pre-oxygenation with 100% oxygen Induction Type: Rapid sequence Ventilation: Mask ventilation without difficulty Laryngoscope Size: Glidescope and 3 Tube size: 7.5 mm Number of attempts: 1 Airway Equipment and Method: Rigid stylet Placement Confirmation: ETT inserted through vocal cords under direct vision, CO2 detector and Breath sounds checked- equal and bilateral Secured at: 22 cm Tube secured with: ETT holder      Medications  fentaNYL in NS (86mcg/ml) infusion-PREMIX (100 mcg/hr Intravenous New  Bag/Given 05/01/2023 0153)  0.9 %  sodium chloride infusion (has no administration in time range)  propofol (DIPRIVAN) 1000 MG/100ML infusion (has no administration in time range)  docusate sodium (COLACE) capsule 100 mg (has no administration in time range)  polyethylene glycol (MIRALAX / GLYCOLAX) packet 17 g (has no administration in time range)  famotidine (PEPCID) tablet 20 mg (has no administration in time range)  pantoprazole (PROTONIX) injection 40 mg (has no administration in time range)  insulin aspart (novoLOG) injection 0-9 Units (has no administration in time range)  docusate (COLACE) 50 MG/5ML liquid 100 mg (has no administration in time range)  polyethylene glycol (MIRALAX / GLYCOLAX) packet 17 g (has no administration in time range)  fentaNYL (SUBLIMAZE) bolus via infusion 50-100 mcg (has no administration in time range)  dexmedetomidine (PRECEDEX) 400 MCG/100ML (4 mcg/mL) infusion (  has no administration in time range)  midazolam (VERSED) injection 1-2 mg (has no administration in time range)  lactated ringers bolus 1,000 mL (0 mLs Intravenous Stopped 05/06/2023 0157)  ketorolac (TORADOL) 30 MG/ML injection 15 mg (15 mg Intravenous Given 05/12/2023 0044)  etomidate (AMIDATE) injection 20 mg (20 mg Intravenous Given 05/02/2023 0158)  rocuronium bromide 10 mg/mL (PF) syringe (100 mg Intravenous Given 04/20/2023 0158)     IMPRESSION / MDM / ASSESSMENT AND PLAN / ED COURSE  I reviewed the triage vital signs and the nursing notes.  Differential diagnosis includes, but is not limited to, cocaine intoxication, viral syndrome, rhabdomyolysis, blood loss anemia, GI bleeding  {Patient presents with symptoms of an acute illness or injury that is potentially life-threatening.  Patient presents from home with diffuse myalgias in the setting of crack cocaine use.  She is intermittently somnolent and erratic.  Notably pale lips and a nonfocal exam.  Due to airway concerns primarily, patient is intubated.   Blood work with hemoglobin of 5.8, which is consistent with her exam considering the very pale mucous membranes.  She is unable to provide any history regarding bleeding symptoms such as GI bleeding.  Remained stable for indications for pressors.  We will start transfusions.  Hyperbilirubinemia is noted alongside normal LFTs and alk phos.  Clinical Course as of 05/08/2023 0205  Wed Apr 22, 2023  0101 Reassessed.  Patient with continued waxing and waning somnolence, sonorous respirations and intermittent erratic delirium. [DS]  0123 I am concerned about her airway in the setting of her somnolence and the intermittent erratic behavior is difficult to manage.  Her presenting hemoglobin is quite low at 5.8 and she will require significant resuscitation.  To facilitate workup and management we will intubate the patient to protect her airway [DS]  0145 I consult ICU NP who will see patient for admission [DS]  0201 Intubated [DS]    Clinical Course User Index [DS] Delton Prairie, MD     FINAL CLINICAL IMPRESSION(S) / ED DIAGNOSES   Final diagnoses:  Acute drug intoxication with complication (HCC)  Acute anemia  Hyperbilirubinemia     Rx / DC Orders   ED Discharge Orders     None        Note:  This document was prepared using Dragon voice recognition software and may include unintentional dictation errors.   Delton Prairie, MD 04/30/2023 [phone]

## 2023-04-21 NOTE — ED Notes (Signed)
Primary RN Richard notified pt coming to room, needs EKG still.

## 2023-04-21 NOTE — ED Triage Notes (Signed)
Pt arrived via [facility] from home with reports of body aches, pt states she was making deviled eggs and started having body aches all over, pt denies any alcohol use, when asked about drug use she says "yeah" but does not state what she used.   Pt does appear pale.

## 2023-04-21 NOTE — ED Triage Notes (Signed)
EMS brings pt in from home for c/o generalized pain "all over"

## 2023-04-22 ENCOUNTER — Inpatient Hospital Stay: Payer: Medicare Other

## 2023-04-22 ENCOUNTER — Emergency Department: Payer: Medicare Other

## 2023-04-22 DIAGNOSIS — I468 Cardiac arrest due to other underlying condition: Secondary | ICD-10-CM | POA: Diagnosis present

## 2023-04-22 DIAGNOSIS — Z8249 Family history of ischemic heart disease and other diseases of the circulatory system: Secondary | ICD-10-CM | POA: Diagnosis not present

## 2023-04-22 DIAGNOSIS — J9601 Acute respiratory failure with hypoxia: Secondary | ICD-10-CM | POA: Insufficient documentation

## 2023-04-22 DIAGNOSIS — D589 Hereditary hemolytic anemia, unspecified: Secondary | ICD-10-CM | POA: Diagnosis present

## 2023-04-22 DIAGNOSIS — A419 Sepsis, unspecified organism: Secondary | ICD-10-CM | POA: Diagnosis present

## 2023-04-22 DIAGNOSIS — T405X1A Poisoning by cocaine, accidental (unintentional), initial encounter: Secondary | ICD-10-CM | POA: Diagnosis present

## 2023-04-22 DIAGNOSIS — F19929 Other psychoactive substance use, unspecified with intoxication, unspecified: Secondary | ICD-10-CM | POA: Diagnosis not present

## 2023-04-22 DIAGNOSIS — E8729 Other acidosis: Secondary | ICD-10-CM | POA: Insufficient documentation

## 2023-04-22 DIAGNOSIS — E785 Hyperlipidemia, unspecified: Secondary | ICD-10-CM | POA: Diagnosis present

## 2023-04-22 DIAGNOSIS — E875 Hyperkalemia: Secondary | ICD-10-CM | POA: Diagnosis present

## 2023-04-22 DIAGNOSIS — G928 Other toxic encephalopathy: Secondary | ICD-10-CM | POA: Insufficient documentation

## 2023-04-22 DIAGNOSIS — N179 Acute kidney failure, unspecified: Secondary | ICD-10-CM | POA: Insufficient documentation

## 2023-04-22 DIAGNOSIS — D65 Disseminated intravascular coagulation [defibrination syndrome]: Secondary | ICD-10-CM | POA: Diagnosis present

## 2023-04-22 DIAGNOSIS — Y92009 Unspecified place in unspecified non-institutional (private) residence as the place of occurrence of the external cause: Secondary | ICD-10-CM | POA: Diagnosis not present

## 2023-04-22 DIAGNOSIS — J189 Pneumonia, unspecified organism: Secondary | ICD-10-CM | POA: Diagnosis present

## 2023-04-22 DIAGNOSIS — R739 Hyperglycemia, unspecified: Secondary | ICD-10-CM | POA: Insufficient documentation

## 2023-04-22 DIAGNOSIS — K7200 Acute and subacute hepatic failure without coma: Secondary | ICD-10-CM | POA: Diagnosis present

## 2023-04-22 DIAGNOSIS — I10 Essential (primary) hypertension: Secondary | ICD-10-CM | POA: Diagnosis present

## 2023-04-22 DIAGNOSIS — E872 Acidosis, unspecified: Secondary | ICD-10-CM | POA: Diagnosis present

## 2023-04-22 DIAGNOSIS — Z8673 Personal history of transient ischemic attack (TIA), and cerebral infarction without residual deficits: Secondary | ICD-10-CM | POA: Diagnosis not present

## 2023-04-22 DIAGNOSIS — R6521 Severe sepsis with septic shock: Secondary | ICD-10-CM | POA: Diagnosis present

## 2023-04-22 DIAGNOSIS — I469 Cardiac arrest, cause unspecified: Secondary | ICD-10-CM

## 2023-04-22 DIAGNOSIS — Z1152 Encounter for screening for COVID-19: Secondary | ICD-10-CM | POA: Diagnosis not present

## 2023-04-22 DIAGNOSIS — Z5941 Food insecurity: Secondary | ICD-10-CM | POA: Diagnosis not present

## 2023-04-22 DIAGNOSIS — Z9911 Dependence on respirator [ventilator] status: Secondary | ICD-10-CM

## 2023-04-22 DIAGNOSIS — F14129 Cocaine abuse with intoxication, unspecified: Secondary | ICD-10-CM | POA: Diagnosis present

## 2023-04-22 DIAGNOSIS — R590 Localized enlarged lymph nodes: Secondary | ICD-10-CM | POA: Diagnosis present

## 2023-04-22 DIAGNOSIS — F17210 Nicotine dependence, cigarettes, uncomplicated: Secondary | ICD-10-CM | POA: Diagnosis present

## 2023-04-22 DIAGNOSIS — Z66 Do not resuscitate: Secondary | ICD-10-CM | POA: Diagnosis not present

## 2023-04-22 DIAGNOSIS — D696 Thrombocytopenia, unspecified: Secondary | ICD-10-CM | POA: Diagnosis present

## 2023-04-22 LAB — TYPE AND SCREEN
ABO/RH(D): O POS
Antibody Screen: POSITIVE
DAT, IgG: POSITIVE
DAT, complement: POSITIVE

## 2023-04-22 LAB — COMPREHENSIVE METABOLIC PANEL
ALT: 2531 U/L — ABNORMAL HIGH (ref 0–44)
AST: 3085 U/L — ABNORMAL HIGH (ref 15–41)
Albumin: 2 g/dL — ABNORMAL LOW (ref 3.5–5.0)
Alkaline Phosphatase: 125 U/L (ref 38–126)
Anion gap: 24 — ABNORMAL HIGH (ref 5–15)
BUN: 28 mg/dL — ABNORMAL HIGH (ref 6–20)
CO2: 10 mmol/L — ABNORMAL LOW (ref 22–32)
Calcium: 9.7 mg/dL (ref 8.9–10.3)
Chloride: 106 mmol/L (ref 98–111)
Creatinine, Ser: 1.57 mg/dL — ABNORMAL HIGH (ref 0.44–1.00)
GFR, Estimated: 38 mL/min — ABNORMAL LOW (ref >60)
Glucose, Bld: 334 mg/dL — ABNORMAL HIGH (ref 70–99)
Potassium: 6.3 mmol/L (ref 3.5–5.1)
Sodium: 140 mmol/L (ref 135–145)
Total Bilirubin: 6 mg/dL — ABNORMAL HIGH (ref 0.3–1.2)
Total Protein: 3.6 g/dL — ABNORMAL LOW (ref 6.5–8.1)

## 2023-04-22 LAB — URINALYSIS, ROUTINE W REFLEX MICROSCOPIC
Bilirubin Urine: NEGATIVE
Glucose, UA: 50 mg/dL — AB
Ketones, ur: NEGATIVE mg/dL
Leukocytes,Ua: NEGATIVE
Nitrite: NEGATIVE
Protein, ur: 100 mg/dL — AB
Specific Gravity, Urine: 1.013 (ref 1.005–1.030)
pH: 6 (ref 5.0–8.0)

## 2023-04-22 LAB — BLOOD GAS, ARTERIAL
Acid-base deficit: 2.6 mmol/L — ABNORMAL HIGH (ref 0.0–2.0)
Acid-base deficit: 17.1 mmol/L — ABNORMAL HIGH (ref 0.0–2.0)
Acid-base deficit: 24.9 mmol/L — ABNORMAL HIGH (ref 0.0–2.0)
Acid-base deficit: 24.8 mmol/L — ABNORMAL HIGH (ref 0.0–2.0)
Bicarbonate: 23.7 mmol/L (ref 20.0–28.0)
Bicarbonate: 10.5 mmol/L — ABNORMAL LOW (ref 20.0–28.0)
Bicarbonate: 8.4 mmol/L — ABNORMAL LOW (ref 20.0–28.0)
Bicarbonate: 6.7 mmol/L — ABNORMAL LOW (ref 20.0–28.0)
FIO2: 30 %
FIO2: 30 %
FIO2: 100 %
FIO2: 100 %
MECHVT: 420 mL
MECHVT: 420 mL
MECHVT: 500 mL
MECHVT: 550 mL
Mechanical Rate: 16
Mechanical Rate: 16
Mechanical Rate: 30
Mechanical Rate: 30
O2 Saturation: 99.2 %
O2 Saturation: 99.4 %
O2 Saturation: 88.5 %
O2 Saturation: 99.1 %
PEEP: 5 cmH2O
PEEP: 5 cmH2O
PEEP: 10 cmH2O
PEEP: 14 cmH2O
Patient temperature: 37
Patient temperature: 37
Patient temperature: 37
Patient temperature: 37
pCO2 arterial: 46 mmHg (ref 32–48)
pCO2 arterial: 30 mmHg — ABNORMAL LOW (ref 32–48)
pCO2 arterial: 46 mmHg (ref 32–48)
pCO2 arterial: 31 mmHg — ABNORMAL LOW (ref 32–48)
pH, Arterial: 7.32 — ABNORMAL LOW (ref 7.35–7.45)
pH, Arterial: 7.15 — CL (ref 7.35–7.45)
pH, Arterial: <6.95 — CL (ref 7.35–7.45)
pH, Arterial: <6.95 — CL (ref 7.35–7.45)
pO2, Arterial: 112 mmHg — ABNORMAL HIGH (ref 83–108)
pO2, Arterial: 94 mmHg (ref 83–108)
pO2, Arterial: 56 mmHg — ABNORMAL LOW (ref 83–108)
pO2, Arterial: 76 mmHg — ABNORMAL LOW (ref 83–108)

## 2023-04-22 LAB — CBC WITH DIFFERENTIAL/PLATELET
Abs Immature Granulocytes: 0.49 10*3/uL — ABNORMAL HIGH (ref 0.00–0.07)
Abs Immature Granulocytes: 0.5 10*3/uL — ABNORMAL HIGH (ref 0.00–0.07)
Abs Immature Granulocytes: 0.8 10*3/uL — ABNORMAL HIGH (ref 0.00–0.07)
Abs Immature Granulocytes: 0.52 10*3/uL — ABNORMAL HIGH (ref 0.00–0.07)
Basophils Absolute: 0 10*3/uL (ref 0.0–0.1)
Basophils Absolute: 0.1 10*3/uL (ref 0.0–0.1)
Basophils Absolute: 0 10*3/uL (ref 0.0–0.1)
Basophils Absolute: 0.1 10*3/uL (ref 0.0–0.1)
Basophils Relative: 0 %
Basophils Relative: 0 %
Basophils Relative: 0 %
Basophils Relative: 1 %
Eosinophils Absolute: 0.2 10*3/uL (ref 0.0–0.5)
Eosinophils Absolute: 0.1 10*3/uL (ref 0.0–0.5)
Eosinophils Absolute: 0.1 10*3/uL (ref 0.0–0.5)
Eosinophils Absolute: 0 10*3/uL (ref 0.0–0.5)
Eosinophils Relative: 3 %
Eosinophils Relative: 1 %
Eosinophils Relative: 0 %
Eosinophils Relative: 0 %
HCT: 8.7 % — CL (ref 36.0–46.0)
HCT: 27.8 % — ABNORMAL LOW (ref 36.0–46.0)
Hemoglobin: 5.8 g/dL — ABNORMAL LOW (ref 12.0–15.0)
Hemoglobin: 5.6 g/dL — ABNORMAL LOW (ref 12.0–15.0)
Hemoglobin: 3.2 g/dL — CL (ref 12.0–15.0)
Hemoglobin: 9.2 g/dL — ABNORMAL LOW (ref 12.0–15.0)
Immature Granulocytes: 5 %
Immature Granulocytes: 3 %
Immature Granulocytes: 4 %
Immature Granulocytes: 4 %
Lymphocytes Relative: 40 %
Lymphocytes Relative: 20 %
Lymphocytes Relative: 20 %
Lymphocytes Relative: 41 %
Lymphs Abs: 3.7 10*3/uL (ref 0.7–4.0)
Lymphs Abs: 3.4 10*3/uL (ref 0.7–4.0)
Lymphs Abs: 4.1 10*3/uL — ABNORMAL HIGH (ref 0.7–4.0)
Lymphs Abs: 6.2 10*3/uL — ABNORMAL HIGH (ref 0.7–4.0)
MCH: 34.8 pg — ABNORMAL HIGH (ref 26.0–34.0)
MCH: 34.2 pg — ABNORMAL HIGH (ref 26.0–34.0)
MCHC: 36.8 g/dL — ABNORMAL HIGH (ref 30.0–36.0)
MCHC: 33.1 g/dL (ref 30.0–36.0)
MCV: 94.6 fL (ref 80.0–100.0)
MCV: 103.3 fL — ABNORMAL HIGH (ref 80.0–100.0)
Monocytes Absolute: 0.3 10*3/uL (ref 0.1–1.0)
Monocytes Absolute: 1.6 10*3/uL — ABNORMAL HIGH (ref 0.1–1.0)
Monocytes Absolute: 2.1 10*3/uL — ABNORMAL HIGH (ref 0.1–1.0)
Monocytes Absolute: 0.7 10*3/uL (ref 0.1–1.0)
Monocytes Relative: 3 %
Monocytes Relative: 10 %
Monocytes Relative: 10 %
Monocytes Relative: 5 %
Neutro Abs: 4.6 10*3/uL (ref 1.7–7.7)
Neutro Abs: 11.3 10*3/uL — ABNORMAL HIGH (ref 1.7–7.7)
Neutro Abs: 13.3 10*3/uL — ABNORMAL HIGH (ref 1.7–7.7)
Neutro Abs: 7.5 10*3/uL (ref 1.7–7.7)
Neutrophils Relative %: 49 %
Neutrophils Relative %: 66 %
Neutrophils Relative %: 66 %
Neutrophils Relative %: 49 %
Platelets: 232 10*3/uL (ref 150–400)
Platelets: 149 10*3/uL — ABNORMAL LOW (ref 150–400)
Platelets: 139 10*3/uL — ABNORMAL LOW (ref 150–400)
Platelets: 45 10*3/uL — ABNORMAL LOW (ref 150–400)
RBC: 0.92 MIL/uL — ABNORMAL LOW (ref 3.87–5.11)
RBC: 2.69 MIL/uL — ABNORMAL LOW (ref 3.87–5.11)
RDW: 13.5 % (ref 11.5–15.5)
RDW: 15.6 % — ABNORMAL HIGH (ref 11.5–15.5)
Smear Review: NORMAL
Smear Review: NORMAL
Smear Review: NORMAL
Smear Review: NORMAL
WBC Morphology: ABNORMAL
WBC: 9.4 10*3/uL (ref 4.0–10.5)
WBC: 17.1 10*3/uL — ABNORMAL HIGH (ref 4.0–10.5)
WBC: 20.4 10*3/uL — ABNORMAL HIGH (ref 4.0–10.5)
WBC: 15.1 10*3/uL — ABNORMAL HIGH (ref 4.0–10.5)
nRBC: 5.4 % — ABNORMAL HIGH (ref 0.0–0.2)
nRBC: 5.1 % — ABNORMAL HIGH (ref 0.0–0.2)

## 2023-04-22 LAB — RESPIRATORY PANEL BY PCR

## 2023-04-22 LAB — BASIC METABOLIC PANEL
Anion gap: 36 — ABNORMAL HIGH (ref 5–15)
Anion gap: 6 (ref 5–15)
Anion gap: 7 (ref 5–15)
BUN: 19 mg/dL (ref 6–20)
BUN: 27 mg/dL — ABNORMAL HIGH (ref 6–20)
BUN: 28 mg/dL — ABNORMAL HIGH (ref 6–20)
CO2: 23 mmol/L (ref 22–32)
CO2: 26 mmol/L (ref 22–32)
CO2: 9 mmol/L — ABNORMAL LOW (ref 22–32)
Calcium: 8.7 mg/dL — ABNORMAL LOW (ref 8.9–10.3)
Calcium: 9.3 mg/dL (ref 8.9–10.3)
Calcium: 9.5 mg/dL (ref 8.9–10.3)
Chloride: 107 mmol/L (ref 98–111)
Chloride: 108 mmol/L (ref 98–111)
Chloride: 97 mmol/L — ABNORMAL LOW (ref 98–111)
Creatinine, Ser: 0.82 mg/dL (ref 0.44–1.00)
Creatinine, Ser: 0.89 mg/dL (ref 0.44–1.00)
Creatinine, Ser: 2.53 mg/dL — ABNORMAL HIGH (ref 0.44–1.00)
GFR, Estimated: 21 mL/min — ABNORMAL LOW (ref >60)
GFR, Estimated: >60 mL/min (ref >60)
GFR, Estimated: >60 mL/min (ref >60)
Glucose, Bld: 111 mg/dL — ABNORMAL HIGH (ref 70–99)
Glucose, Bld: 127 mg/dL — ABNORMAL HIGH (ref 70–99)
Glucose, Bld: 442 mg/dL — ABNORMAL HIGH (ref 70–99)
Potassium: 3.8 mmol/L (ref 3.5–5.1)
Potassium: 4.9 mmol/L (ref 3.5–5.1)
Potassium: 6.1 mmol/L — ABNORMAL HIGH (ref 3.5–5.1)
Sodium: 137 mmol/L (ref 135–145)
Sodium: 140 mmol/L (ref 135–145)
Sodium: 142 mmol/L (ref 135–145)

## 2023-04-22 LAB — CBC
HCT: 23 % — ABNORMAL LOW (ref 36.0–46.0)
Hemoglobin: 5.1 g/dL — ABNORMAL LOW (ref 12.0–15.0)
Hemoglobin: 7.4 g/dL — ABNORMAL LOW (ref 12.0–15.0)
MCH: 32.2 pg (ref 26.0–34.0)
MCHC: 32.2 g/dL (ref 30.0–36.0)
MCV: 100 fL (ref 80.0–100.0)
Platelets: 142 10*3/uL — ABNORMAL LOW (ref 150–400)
Platelets: 66 10*3/uL — ABNORMAL LOW (ref 150–400)
RBC: 2.3 MIL/uL — ABNORMAL LOW (ref 3.87–5.11)
RDW: 14.5 % (ref 11.5–15.5)
WBC: 14.9 10*3/uL — ABNORMAL HIGH (ref 4.0–10.5)
WBC: 16.5 10*3/uL — ABNORMAL HIGH (ref 4.0–10.5)
nRBC: 5.5 % — ABNORMAL HIGH (ref 0.0–0.2)

## 2023-04-22 LAB — GLUCOSE, CAPILLARY
Glucose-Capillary: 112 mg/dL — ABNORMAL HIGH (ref 70–99)
Glucose-Capillary: 128 mg/dL — ABNORMAL HIGH (ref 70–99)
Glucose-Capillary: 13 mg/dL — CL (ref 70–99)
Glucose-Capillary: 50 mg/dL — ABNORMAL LOW (ref 70–99)
Glucose-Capillary: 51 mg/dL — ABNORMAL LOW (ref 70–99)
Glucose-Capillary: 172 mg/dL — ABNORMAL HIGH (ref 70–99)
Glucose-Capillary: 20 mg/dL — CL (ref 70–99)
Glucose-Capillary: 360 mg/dL — ABNORMAL HIGH (ref 70–99)
Glucose-Capillary: 270 mg/dL — ABNORMAL HIGH (ref 70–99)
Glucose-Capillary: 171 mg/dL — ABNORMAL HIGH (ref 70–99)
Glucose-Capillary: 406 mg/dL — ABNORMAL HIGH (ref 70–99)
Glucose-Capillary: 352 mg/dL — ABNORMAL HIGH (ref 70–99)
Glucose-Capillary: 393 mg/dL — ABNORMAL HIGH (ref 70–99)

## 2023-04-22 LAB — HEPATIC FUNCTION PANEL
ALT: 14 U/L (ref 0–44)
ALT: 20 U/L (ref 0–44)
AST: 23 U/L (ref 15–41)
AST: 96 U/L — ABNORMAL HIGH (ref 15–41)
Albumin: 3.3 g/dL — ABNORMAL LOW (ref 3.5–5.0)
Albumin: 3.9 g/dL (ref 3.5–5.0)
Alkaline Phosphatase: 119 U/L (ref 38–126)
Alkaline Phosphatase: 129 U/L — ABNORMAL HIGH (ref 38–126)
Bilirubin, Direct: 0.6 mg/dL — ABNORMAL HIGH (ref 0.0–0.2)
Bilirubin, Direct: 1.8 mg/dL — ABNORMAL HIGH (ref 0.0–0.2)
Indirect Bilirubin: 3.8 mg/dL — ABNORMAL HIGH (ref 0.3–0.9)
Indirect Bilirubin: 4.2 mg/dL — ABNORMAL HIGH (ref 0.3–0.9)
Total Bilirubin: 4.4 mg/dL — ABNORMAL HIGH (ref 0.3–1.2)
Total Bilirubin: 6 mg/dL — ABNORMAL HIGH (ref 0.3–1.2)
Total Protein: 5.9 g/dL — ABNORMAL LOW (ref 6.5–8.1)
Total Protein: 6.6 g/dL (ref 6.5–8.1)

## 2023-04-22 LAB — BRAIN NATRIURETIC PEPTIDE: B Natriuretic Peptide: 83.5 pg/mL (ref 0.0–100.0)

## 2023-04-22 LAB — CK
Total CK: 22 U/L — ABNORMAL LOW (ref 38–234)
Total CK: 34 U/L — ABNORMAL LOW (ref 38–234)

## 2023-04-22 LAB — ABO/RH: ABO/RH(D): O POS

## 2023-04-22 LAB — FIBRINOGEN
Fibrinogen: 271 mg/dL (ref 210–475)
Fibrinogen: 59 mg/dL — CL (ref 210–475)

## 2023-04-22 LAB — TECHNOLOGIST SMEAR REVIEW: Plt Morphology: NORMAL

## 2023-04-22 LAB — APTT
aPTT: 39 s — ABNORMAL HIGH (ref 24–36)
aPTT: 147 s — ABNORMAL HIGH (ref 24–36)

## 2023-04-22 LAB — URINE DRUG SCREEN, QUALITATIVE (ARMC ONLY)
Amphetamines, Ur Screen: NOT DETECTED
Barbiturates, Ur Screen: NOT DETECTED
Benzodiazepine, Ur Scrn: POSITIVE — AB
Cannabinoid 50 Ng, Ur ~~LOC~~: NOT DETECTED
Cocaine Metabolite,Ur ~~LOC~~: POSITIVE — AB
MDMA (Ecstasy)Ur Screen: NOT DETECTED
Methadone Scn, Ur: NOT DETECTED
Opiate, Ur Screen: NOT DETECTED
Phencyclidine (PCP) Ur S: NOT DETECTED
Tricyclic, Ur Screen: NOT DETECTED

## 2023-04-22 LAB — PROTIME-INR
INR: 1.3 — ABNORMAL HIGH (ref 0.8–1.2)
INR: 5.6 (ref 0.8–1.2)
Prothrombin Time: 16.6 s — ABNORMAL HIGH (ref 11.4–15.2)
Prothrombin Time: 50.8 s — ABNORMAL HIGH (ref 11.4–15.2)

## 2023-04-22 LAB — LACTIC ACID, PLASMA
Lactic Acid, Venous: 4.3 mmol/L (ref 0.5–1.9)
Lactic Acid, Venous: 4.4 mmol/L (ref 0.5–1.9)
Lactic Acid, Venous: >9 mmol/L (ref 0.5–1.9)
Lactic Acid, Venous: >9 mmol/L (ref 0.5–1.9)
Lactic Acid, Venous: >9 mmol/L (ref 0.5–1.9)
Lactic Acid, Venous: >9 mmol/L (ref 0.5–1.9)

## 2023-04-22 LAB — HIV ANTIBODY (ROUTINE TESTING W REFLEX): HIV Screen 4th Generation wRfx: NONREACTIVE

## 2023-04-22 LAB — RETICULOCYTES
Immature Retic Fract: 28.5 % — ABNORMAL HIGH (ref 2.3–15.9)
RBC.: 1.11 MIL/uL — ABNORMAL LOW (ref 3.87–5.11)
Retic Count, Absolute: 40.8 10*3/uL (ref 19.0–186.0)
Retic Ct Pct: 3.7 % — ABNORMAL HIGH (ref 0.4–3.1)

## 2023-04-22 LAB — HEMOGLOBIN A1C
Hgb A1c MFr Bld: 4.4 % — ABNORMAL LOW (ref 4.8–5.6)
Mean Plasma Glucose: 79.58 mg/dL

## 2023-04-22 LAB — IRON AND TIBC
Iron: 235 ug/dL — ABNORMAL HIGH (ref 28–170)
Saturation Ratios: 101 % — ABNORMAL HIGH (ref 10.4–31.8)
TIBC: 232 ug/dL — ABNORMAL LOW (ref 250–450)

## 2023-04-22 LAB — FERRITIN: Ferritin: >7500 ng/mL — ABNORMAL HIGH (ref 11–307)

## 2023-04-22 LAB — FOLATE: Folate: 7.5 ng/mL (ref >5.9)

## 2023-04-22 LAB — SARS CORONAVIRUS 2 BY RT PCR: SARS Coronavirus 2 by RT PCR: NEGATIVE

## 2023-04-22 LAB — MRSA NEXT GEN BY PCR, NASAL: MRSA by PCR Next Gen: NOT DETECTED

## 2023-04-22 LAB — PHOSPHORUS
Phosphorus: 3.3 mg/dL (ref 2.5–4.6)
Phosphorus: 12 mg/dL — ABNORMAL HIGH (ref 2.5–4.6)

## 2023-04-22 LAB — STREP PNEUMONIAE URINARY ANTIGEN: Strep Pneumo Urinary Antigen: NEGATIVE

## 2023-04-22 LAB — MAGNESIUM
Magnesium: 2.2 mg/dL (ref 1.7–2.4)
Magnesium: 2.2 mg/dL (ref 1.7–2.4)
Magnesium: 2.8 mg/dL — ABNORMAL HIGH (ref 1.7–2.4)

## 2023-04-22 LAB — LACTATE DEHYDROGENASE: LDH: 971 U/L — ABNORMAL HIGH (ref 98–192)

## 2023-04-22 LAB — TROPONIN I (HIGH SENSITIVITY)
Troponin I (High Sensitivity): 11 ng/L (ref <18)
Troponin I (High Sensitivity): 19 ng/L — ABNORMAL HIGH (ref <18)

## 2023-04-22 LAB — PREPARE RBC (CROSSMATCH)

## 2023-04-22 LAB — HEPATITIS B CORE ANTIBODY, TOTAL: Hep B Core Total Ab: REACTIVE — AB

## 2023-04-22 LAB — LIPASE, BLOOD: Lipase: 44 U/L (ref 11–51)

## 2023-04-22 LAB — HEPATITIS B SURFACE ANTIGEN: Hepatitis B Surface Ag: NONREACTIVE

## 2023-04-22 LAB — PROCALCITONIN: Procalcitonin: 0.96 ng/mL

## 2023-04-22 LAB — VITAMIN B12: Vitamin B-12: 1125 pg/mL — ABNORMAL HIGH (ref 180–914)

## 2023-04-22 MED ORDER — KETOROLAC TROMETHAMINE 30 MG/ML IJ SOLN
15.0000 mg | Freq: Once | INTRAMUSCULAR | Status: AC
  Administered 2023-04-22: 15 mg via INTRAVENOUS
  Filled 2023-04-22: qty 1

## 2023-04-22 MED ORDER — METHYLPREDNISOLONE SODIUM SUCC 125 MG IJ SOLR
125.0000 mg | Freq: Two times a day (BID) | INTRAMUSCULAR | Status: DC
  Administered 2023-04-22: 125 mg via INTRAVENOUS
  Filled 2023-04-22: qty 2

## 2023-04-22 MED ORDER — DEXTROSE 50 % IV SOLN: 0.0000 mL | INTRAVENOUS | Status: DC | PRN

## 2023-04-22 MED ORDER — NOREPINEPHRINE 4 MG/250ML-% IV SOLN
2.0000 ug/min | INTRAVENOUS | Status: DC
  Administered 2023-04-22: 2 ug/min via INTRAVENOUS
  Filled 2023-04-22: qty 250

## 2023-04-22 MED ORDER — DEXTROSE 50 % IV SOLN
25.0000 g | Freq: Once | INTRAVENOUS | Status: AC
  Administered 2023-04-22: 25 g via INTRAVENOUS
  Filled 2023-04-22: qty 50

## 2023-04-22 MED ORDER — DEXTROSE 50 % IV SOLN
INTRAVENOUS | Status: AC
  Administered 2023-04-22: 50 mL
  Filled 2023-04-22: qty 50

## 2023-04-22 MED ORDER — INSULIN REGULAR(HUMAN) IN NACL 100-0.9 UT/100ML-% IV SOLN
INTRAVENOUS | Status: DC
  Administered 2023-04-22: 4.6 [IU]/h via INTRAVENOUS

## 2023-04-22 MED ORDER — ALBUMIN HUMAN 25 % IV SOLN
25.0000 g | Freq: Once | INTRAVENOUS | Status: AC
  Administered 2023-04-22: 25 g via INTRAVENOUS
  Filled 2023-04-22: qty 100

## 2023-04-22 MED ORDER — CHLORHEXIDINE GLUCONATE CLOTH 2 % EX PADS
6.0000 | MEDICATED_PAD | Freq: Every day | CUTANEOUS | Status: DC
  Administered 2023-04-22: 6 via TOPICAL

## 2023-04-22 MED ORDER — SODIUM BICARBONATE 8.4 % IV SOLN
INTRAVENOUS | Status: DC
  Filled 2023-04-22: qty 1000
  Filled 2023-04-22: qty 150

## 2023-04-22 MED ORDER — FREE WATER: 30.0000 mL | Status: DC

## 2023-04-22 MED ORDER — SODIUM BICARBONATE 8.4 % IV SOLN
INTRAVENOUS | Status: AC
  Administered 2023-04-22: 50 meq
  Filled 2023-04-22: qty 50

## 2023-04-22 MED ORDER — IMMUNE GLOBULIN (HUMAN) 10 GM/100ML IV SOLN
400.0000 mg/kg | INTRAVENOUS | Status: DC
  Filled 2023-04-22: qty 250

## 2023-04-22 MED ORDER — MIDAZOLAM HCL 2 MG/2ML IJ SOLN: 1.0000 mg | INTRAMUSCULAR | Status: DC | PRN

## 2023-04-22 MED ORDER — INSULIN ASPART 100 UNIT/ML IV SOLN
10.0000 [IU] | Freq: Once | INTRAVENOUS | Status: AC
  Administered 2023-04-22: 10 [IU] via INTRAVENOUS
  Filled 2023-04-22: qty 0.1

## 2023-04-22 MED ORDER — NOREPINEPHRINE 16 MG/250ML-% IV SOLN
0.0000 ug/min | INTRAVENOUS | Status: DC
  Administered 2023-04-22: 75 ug/min via INTRAVENOUS
  Administered 2023-04-22: 85 ug/min via INTRAVENOUS
  Administered 2023-04-22: 75 ug/min via INTRAVENOUS
  Administered 2023-04-22: 95 ug/min via INTRAVENOUS
  Filled 2023-04-22 (×4): qty 250

## 2023-04-22 MED ORDER — SODIUM CHLORIDE 0.9 % IV SOLN: 250.0000 mL | INTRAVENOUS | Status: DC

## 2023-04-22 MED ORDER — CALCIUM GLUCONATE-NACL 2-0.675 GM/100ML-% IV SOLN
2.0000 g | Freq: Once | INTRAVENOUS | Status: AC
  Administered 2023-04-22: 2000 mg via INTRAVENOUS
  Filled 2023-04-22: qty 100

## 2023-04-22 MED ORDER — PANTOPRAZOLE SODIUM 40 MG IV SOLR
40.0000 mg | Freq: Two times a day (BID) | INTRAVENOUS | Status: DC
  Administered 2023-04-22 (×2): 40 mg via INTRAVENOUS
  Filled 2023-04-22 (×3): qty 10

## 2023-04-22 MED ORDER — FOLIC ACID 5 MG/ML IJ SOLN
1.0000 mg | Freq: Every day | INTRAMUSCULAR | Status: DC
  Administered 2023-04-22: 1 mg via INTRAVENOUS
  Filled 2023-04-22 (×2): qty 0.2

## 2023-04-22 MED ORDER — PROPOFOL 1000 MG/100ML IV EMUL
5.0000 ug/kg/min | INTRAVENOUS | Status: DC
  Administered 2023-04-22: 30 ug/kg/min via INTRAVENOUS
  Administered 2023-04-22: 10 ug/kg/min via INTRAVENOUS
  Filled 2023-04-22 (×2): qty 100

## 2023-04-22 MED ORDER — ETOMIDATE 2 MG/ML IV SOLN
20.0000 mg | Freq: Once | INTRAVENOUS | Status: AC
  Administered 2023-04-22: 20 mg via INTRAVENOUS
  Filled 2023-04-22: qty 10

## 2023-04-22 MED ORDER — IOHEXOL 300 MG/ML  SOLN
100.0000 mL | Freq: Once | INTRAMUSCULAR | Status: AC | PRN
  Administered 2023-04-22: 100 mL via INTRAVENOUS

## 2023-04-22 MED ORDER — SODIUM CHLORIDE 0.9 % IV SOLN
1.0000 g | INTRAVENOUS | Status: DC
  Administered 2023-04-22: 1 g via INTRAVENOUS
  Filled 2023-04-22 (×2): qty 10

## 2023-04-22 MED ORDER — ORAL CARE MOUTH RINSE: 15.0000 mL | OROMUCOSAL | Status: DC | PRN

## 2023-04-22 MED ORDER — SODIUM CHLORIDE 0.9 % IV SOLN: 10.0000 mL/h | Freq: Once | INTRAVENOUS | Status: DC

## 2023-04-22 MED ORDER — VITAL AF 1.2 CAL PO LIQD: 1000.0000 mL | ORAL | Status: DC

## 2023-04-22 MED ORDER — LACTATED RINGERS IV BOLUS
500.0000 mL | Freq: Once | INTRAVENOUS | Status: AC
  Administered 2023-04-22: 500 mL via INTRAVENOUS

## 2023-04-22 MED ORDER — ORAL CARE MOUTH RINSE
15.0000 mL | OROMUCOSAL | Status: DC
  Administered 2023-04-22 (×7): 15 mL via OROMUCOSAL

## 2023-04-22 MED ORDER — FENTANYL BOLUS VIA INFUSION: 50.0000 ug | INTRAVENOUS | Status: DC | PRN

## 2023-04-22 MED ORDER — ROCURONIUM BROMIDE 10 MG/ML (PF) SYRINGE
100.0000 mg | PREFILLED_SYRINGE | Freq: Once | INTRAVENOUS | Status: AC
  Administered 2023-04-22: 100 mg via INTRAVENOUS
  Filled 2023-04-22: qty 10

## 2023-04-22 MED ORDER — VASOPRESSIN 20 UNITS/100 ML INFUSION FOR SHOCK
0.0000 [IU]/min | INTRAVENOUS | Status: DC
  Administered 2023-04-22: 0.04 [IU]/min via INTRAVENOUS
  Filled 2023-04-22: qty 100

## 2023-04-22 MED ORDER — DOCUSATE SODIUM 50 MG/5ML PO LIQD
100.0000 mg | Freq: Two times a day (BID) | ORAL | Status: DC
  Administered 2023-04-22 (×2): 100 mg
  Filled 2023-04-22 (×2): qty 10

## 2023-04-22 MED ORDER — DEXTROSE 50 % IV SOLN
25.0000 g | INTRAVENOUS | Status: AC
  Administered 2023-04-22: 25 g via INTRAVENOUS

## 2023-04-22 MED ORDER — SODIUM BICARBONATE 8.4 % IV SOLN
100.0000 meq | Freq: Once | INTRAVENOUS | Status: AC
  Administered 2023-04-22: 100 meq via INTRAVENOUS
  Filled 2023-04-22: qty 100

## 2023-04-22 MED ORDER — DEXTROSE 50 % IV SOLN
INTRAVENOUS | Status: AC
  Filled 2023-04-22: qty 50

## 2023-04-22 MED ORDER — SODIUM CHLORIDE 0.9 % IV SOLN
500.0000 mg | INTRAVENOUS | Status: DC
  Administered 2023-04-22: 500 mg via INTRAVENOUS
  Filled 2023-04-22 (×2): qty 5

## 2023-04-22 MED ORDER — LACTATED RINGERS IV BOLUS
1000.0000 mL | Freq: Once | INTRAVENOUS | Status: AC
  Administered 2023-04-22: 1000 mL via INTRAVENOUS

## 2023-04-22 MED ORDER — FAMOTIDINE 20 MG PO TABS
20.0000 mg | ORAL_TABLET | Freq: Two times a day (BID) | ORAL | Status: DC
  Administered 2023-04-22: 20 mg
  Filled 2023-04-22 (×2): qty 1

## 2023-04-22 MED ORDER — INSULIN ASPART 100 UNIT/ML IJ SOLN
0.0000 [IU] | INTRAMUSCULAR | Status: DC
  Administered 2023-04-22: 1 [IU] via SUBCUTANEOUS
  Filled 2023-04-22: qty 1

## 2023-04-22 MED ORDER — DOCUSATE SODIUM 100 MG PO CAPS: 100.0000 mg | ORAL_CAPSULE | Freq: Two times a day (BID) | ORAL | Status: DC | PRN

## 2023-04-22 MED ORDER — FENTANYL 2500MCG IN NS 250ML (10MCG/ML) PREMIX INFUSION
100.0000 ug/h | INTRAVENOUS | Status: DC
  Administered 2023-04-22: 100 ug/h via INTRAVENOUS
  Filled 2023-04-22: qty 250

## 2023-04-22 MED ORDER — POLYETHYLENE GLYCOL 3350 17 G PO PACK: 17.0000 g | PACK | Freq: Every day | ORAL | Status: DC | PRN

## 2023-04-22 MED ORDER — POLYETHYLENE GLYCOL 3350 17 G PO PACK
17.0000 g | PACK | Freq: Every day | ORAL | Status: DC
  Administered 2023-04-22: 17 g
  Filled 2023-04-22: qty 1

## 2023-04-22 MED ORDER — DEXMEDETOMIDINE HCL IN NACL 400 MCG/100ML IV SOLN
0.0000 ug/kg/h | INTRAVENOUS | Status: DC
  Administered 2023-04-22: 0.8 ug/kg/h via INTRAVENOUS
  Administered 2023-04-22: 0.6 ug/kg/h via INTRAVENOUS
  Filled 2023-04-22 (×2): qty 100

## 2023-04-22 MED ORDER — SODIUM CHLORIDE 0.9% IV SOLUTION: Freq: Once | INTRAVENOUS | Status: DC

## 2023-04-22 MED ORDER — SODIUM CHLORIDE 0.9% IV SOLUTION: Freq: Once | INTRAVENOUS | Status: AC

## 2023-04-22 MED ORDER — EPINEPHRINE HCL 5 MG/250ML IV SOLN IN NS
0.5000 ug/min | INTRAVENOUS | Status: DC
  Administered 2023-04-22: 20 ug/min via INTRAVENOUS
  Administered 2023-04-22: 11 ug/min via INTRAVENOUS
  Filled 2023-04-22 (×3): qty 250

## 2023-04-23 LAB — CBC
HCT: 9 % — ABNORMAL LOW (ref 36.0–46.0)
Hemoglobin: 3.2 g/dL — CL (ref 12.0–15.0)
MCH: 35.6 pg — ABNORMAL HIGH (ref 26.0–34.0)
MCHC: 35.6 g/dL (ref 30.0–36.0)
MCV: 100 fL (ref 80.0–100.0)
Platelets: 77 10*3/uL — ABNORMAL LOW (ref 150–400)
RBC: 0.9 MIL/uL — ABNORMAL LOW (ref 3.87–5.11)
RDW: 14.8 % (ref 11.5–15.5)
WBC: 11 10*3/uL — ABNORMAL HIGH (ref 4.0–10.5)
nRBC: 5.2 % — ABNORMAL HIGH (ref 0.0–0.2)

## 2023-04-23 LAB — BPAM FFP
Blood Product Expiration Date: 202409092359
Blood Product Expiration Date: 202409092359
ISSUE DATE / TIME: 202409042009
ISSUE DATE / TIME: 202409042009
Unit Type and Rh: 5100
Unit Type and Rh: 5100

## 2023-04-23 LAB — BPAM CRYOPRECIPITATE
Blood Product Expiration Date: 202409050131
ISSUE DATE / TIME: 202409042051
Unit Type and Rh: 6200

## 2023-04-23 LAB — LEGIONELLA PNEUMOPHILA SEROGP 1 UR AG: L. pneumophila Serogp 1 Ur Ag: NEGATIVE

## 2023-04-23 LAB — PREPARE CRYOPRECIPITATE: Unit division: 0

## 2023-04-23 LAB — PREPARE FRESH FROZEN PLASMA: Unit division: 0

## 2023-04-23 LAB — HAPTOGLOBIN: Haptoglobin: <10 mg/dL — ABNORMAL LOW (ref 33–346)

## 2023-04-23 LAB — PREPARE RBC (CROSSMATCH)

## 2023-04-23 LAB — FIBRINOGEN: Fibrinogen: <60 mg/dL — CL (ref 210–475)

## 2023-04-23 NOTE — Progress Notes (Signed)
   05/17/2023 0000  Spiritual Encounters  Type of Visit Follow up  Care provided to: Pt and family  Conversation partners present during encounter Nurse  Referral source Family  Reason for visit Code  OnCall Visit Yes  Spiritual Framework  Presenting Themes Impactful experiences and emotions  Community/Connection Family;Friend(s)  Patient Stress Factors Loss  Family Stress Factors Loss  Interventions  Spiritual Care Interventions Made Compassionate presence;Reflective listening;Bereavement/grief support;Prayer;Supported grief process;Provided grief education  Spiritual Care Plan  Spiritual Care Issues Still Outstanding No further spiritual care needs at this time (see row info)   Chaplain received a end of life page from nurse. Chaplain met with the patients family and prayed with them. Chaplain offered grief support and helped them with funeral home arrangements. Family was grateful for the support and thanked the chaplain for all my help today.

## 2023-04-24 LAB — BPAM RBC
Blood Product Expiration Date: 202409232359
Blood Product Expiration Date: 202409252359
Blood Product Expiration Date: 202410022359
Blood Product Expiration Date: 202410022359
Blood Product Expiration Date: 202410062359
Blood Product Expiration Date: 202410062359
ISSUE DATE / TIME: 202409041500
ISSUE DATE / TIME: 202409041500
ISSUE DATE / TIME: 202409041517
ISSUE DATE / TIME: 202409041517
ISSUE DATE / TIME: 202409060835
ISSUE DATE / TIME: 202409061043
Unit Type and Rh: 5100
Unit Type and Rh: 5100
Unit Type and Rh: 5100
Unit Type and Rh: 5100
Unit Type and Rh: 5100
Unit Type and Rh: 5100

## 2023-04-24 LAB — TYPE AND SCREEN
ABO/RH(D): O POS
Antibody Screen: POSITIVE
DAT, IgG: POSITIVE
DAT, complement: POSITIVE
Donor AG Type: NEGATIVE
Donor AG Type: NEGATIVE
Unit division: 0
Unit division: 0
Unit division: 0
Unit division: 0
Unit division: 0
Unit division: 0

## 2023-04-26 LAB — PARVOVIRUS B19 ANTIBODY, IGG AND IGM
Parovirus B19 IgG Abs: 6.4 {index} — ABNORMAL HIGH (ref 0.0–0.8)
Parovirus B19 IgM Abs: 0.1 {index} (ref 0.0–0.8)

## 2023-04-26 LAB — HCV RNA DIAGNOSIS, NAA: HCV RNA, Quantitation: NOT DETECTED [IU]/mL

## 2023-04-27 LAB — CULTURE, BLOOD (ROUTINE X 2)
Culture: NO GROWTH
Culture: NO GROWTH
Special Requests: ADEQUATE
Special Requests: ADEQUATE

## 2023-04-27 NOTE — Group Note (Deleted)

## 2023-05-19 NOTE — H&P (Addendum)
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   ED Course: Initial vital signs showed HR of beats/minute, BP 108/60 mm Hg, the RR 18 breaths/minute, and the oxygen saturation 100% on RA and a temperature of 98.49F. Patient was initially able to answer orientation questions appropriately and even admitted to using cocaine. Later she was noted to be lethargic, and responded with one-word answers; and going in and out of somnolence. Due to concerns that she may not be able to protect her airway, she was intubated for airway protection. Pertinent Labs/Diagnostics Findings unremarkable except for total bilirubin 4.4 and hgb of 5.8 without overt source of bleeding, CXR> CTH> CTA Chest> CT Abd/pelvis> see below  PCCM consulted for admission.  Past Medical History  CVA on DUAP,  Ataxia, HTN, HLD, Tobacco abuse, Cocaine abuse and bradycardia   Significant Hospital Events   10-May-2023: Admit to ICU with altered mental status in the setting of acute drug intoxication  Consults:  None  Procedures:  05-10-2023: Intubation  Significant Diagnostic Tests:  05-10-23: Chest Xray> IMPRESSION: Interstitial prominence within the lungs which could reflect interstitial edema or atypical infection.  05/10/23: Noncontrast CT head> IMPRESSION: No acute intracranial abnormality in a patient with chronic infarction.  2023-05-10: CTA Chest, abdomen and pelvis> IMPRESSION: 1. Endotracheal terminates 1 cm of the carina, recommend retraction by 1 cm. 2. Peribronchovascular patchy ground-glass airspace opacities. Findings suggestive of infection/inflammation such as COVID 19. recommend  attention on follow-up to evaluate for complete resolution in 3 months. 3. Mediastinal and bilateral hilar lymphadenopathy-likely reactive in etiology. Recommend follow-up to evaluate for resolution and exclude underlying malignancy/metastases. 4. No central or proximal segmental pulmonary embolus. Limited evaluation more distally due to timing of contrast and motion artifact. 5. Mild splenomegaly. 6. Colonic diverticulosis with no acute diverticulitis.   Micro Data:  05-10-2023: SARS-CoV-2 PCR> negative 2023-05-10: Influenza PCR> negative 05/10/23: Blood culture x2> 05-10-2023: MRSA PCR>>   Antimicrobials:  Azithromycin 9/4> Ceftriaxone 9/4>  OBJECTIVE  Blood pressure 114/61, pulse 76, temperature (!) 93.4 F (34.1 C), resp. rate 20, height 5\' 7"  (1.702 m), weight 78.5 kg, SpO2 99%.    Vent Mode: AC FiO2 (%):  [30 %] 30 % Set Rate:  [16 bmp] 16 bmp Vt Set:  [420 mL] 420 mL PEEP:  [5 cmH20] 5 cmH20  No intake or output data in the 24 hours ending 05-10-23 0305 Filed Weights   04/28/2023 2329  Weight: 78.5 kg   Physical Examination  GENERAL: 62 year-old critically ill patient lying in the bed intubated and sedated EYES: PEERLA. No scleral icterus. Extraocular muscles intact.  HEENT: Head atraumatic, normocephalic. Oropharynx and nasopharynx clear.  NECK:  No JVD, supple  LUNGS:Decreased  breath sounds bilaterally.  No use of accessory muscles of respiration.  CARDIOVASCULAR: S1, S2 normal. No murmurs, rubs, or gallops.  ABDOMEN: Soft, NTND EXTREMITIES: No swelling or erythema.  Capillary refill >3 seconds in all extremities. Pulses palpable distally. NEUROLOGIC: The patient is intubated and sedated . No focal neurological deficit appreciated. Cranial nerves are intact.  SKIN: No obvious rash, lesion, or ulcer. Cool to touch, pale appearing Labs/imaging that I havepersonally reviewed  (  right click and "Reselect all SmartList Selections" daily)     Labs   CBC: Recent Labs  Lab 05-19-23 2336   WBC 9.4  NEUTROABS 4.6  HGB 5.8*  HCT RESULTS UNAVAILABLE DUE TO INTERFERING SUBSTANCE  MCV RESULTS UNAVAILABLE DUE TO INTERFERING SUBSTANCE  PLT 232    Basic Metabolic Panel: Recent Labs  Lab May 19, 2023 2336 05-19-2023 2358  NA 140  --   K 3.8  --   CL 107  --   CO2 26  --   GLUCOSE 111*  --   BUN 19  --   CREATININE 0.89  --   CALCIUM 9.3  --   MG  --  2.2   GFR: Estimated Creatinine Clearance: 72.6 mL/min (by C-G formula based on SCr of 0.89 mg/dL). Recent Labs  Lab 05/19/2023 2336  WBC 9.4    Liver Function Tests: Recent Labs  Lab 05-19-23 2358  AST 23  ALT 14  ALKPHOS 119  BILITOT 4.4*  PROT 6.6  ALBUMIN 3.9   Recent Labs  Lab May 19, 2023 2358  LIPASE 44   No results for input(s): "AMMONIA" in the last 168 hours.  ABG No results found for: "PHART", "PCO2ART", "PO2ART", "HCO3", "TCO2", "ACIDBASEDEF", "O2SAT"   Coagulation Profile: Recent Labs  Lab 05/16/2023 0139  INR 1.3*    Cardiac Enzymes: Recent Labs  Lab 05/19/23 2358  CKTOTAL 22*    HbA1C: Hemoglobin A1C  Date/Time Value Ref Range Status  12/26/2017 11:53 AM 5.0  Final  05/30/2015 12:00 AM 4.9  Final   Hgb A1c MFr Bld  Date/Time Value Ref Range Status  12/24/2021 12:55 PM 5.4 4.8 - 5.6 % Final    Comment:             Prediabetes: 5.7 - 6.4          Diabetes: >6.4          Glycemic control for adults with diabetes: <7.0   06/08/2020 06:31 AM 5.2 4.8 - 5.6 % Final    Comment:    (NOTE)         Prediabetes: 5.7 - 6.4         Diabetes: >6.4         Glycemic control for adults with diabetes: <7.0     CBG: No results for input(s): "GLUCAP" in the last 168 hours.  Review of Systems:   Unable to be obtained secondary to the patient's intubated and sedated status.   Past Medical History  She,  has a past medical history of Hyperlipidemia, Hypertension, and Stroke (HCC).   Surgical History    Past Surgical History:  Procedure Laterality Date   TONSILLECTOMY     TUBAL  LIGATION       Social History   reports that she has been smoking cigarettes. She has never used smokeless tobacco. She reports current alcohol use. She reports current drug use. Frequency: 2.00 times per week. Drug: Cocaine.   Family History   Her family history includes Alcohol abuse in her father and paternal grandfather; Breast cancer (age of onset: 40) in her paternal grandmother; Colon cancer in her mother; Diabetes in her maternal grandmother; Heart disease in her maternal grandmother and mother; Hypertension in her mother; Other in her father and maternal grandfather; Thyroid disease in her mother.   Allergies No Known Allergies   Home Medications  Prior to Admission medications   Medication Sig Start Date End Date Taking? Authorizing Provider  cyclobenzaprine (FLEXERIL) 10 MG tablet Take  10 mg by mouth 3 (three) times daily as needed for muscle spasms.   Yes [provider]  hydrOXYzine (VISTARIL) 50 MG capsule Take 50 mg by mouth 3 (three) times daily as needed.   Yes [provider]  sodium chloride (OCEAN) 0.65 % SOLN nasal spray Place 1 spray into both nostrils as needed for congestion. 12/24/21  Yes Iloabachie, Chioma E, NP  amLODipine (NORVASC) 5 MG tablet TAKE 1 TABLET BY MOUTH ONCE A DAY Patient not taking: Reported on 05/13/2022 11/12/21   Iloabachie, Chioma E, NP  aspirin 81 MG EC tablet Take 1 tablet (81 mg total) by mouth once daily. Swallow whole. Patient not taking: Reported on 05/13/2022 11/12/21   Iloabachie, Chioma E, NP  atorvastatin (LIPITOR) 40 MG tablet Take 1 tablet (40 mg total) by mouth once daily. Patient not taking: Reported on 05/13/2022 11/12/21   Iloabachie, Chioma E, NP  clopidogrel (PLAVIX) 75 MG tablet Take 1 tablet (75 mg total) by mouth once daily. Patient not taking: Reported on 05/13/2022 12/24/21   Iloabachie, Chioma E, NP  lidocaine (HM LIDOCAINE PATCH) 4 % Place 1 patch onto the skin daily. Patient not taking: Reported on 02-May-2023  05/13/22   Eulogio Bear E, NP  Scheduled Meds:  Chlorhexidine Gluconate Cloth  6 each Topical Daily   docusate  100 mg Per Tube BID   famotidine  20 mg Per Tube BID   insulin aspart  0-9 Units Subcutaneous Q4H   pantoprazole (PROTONIX) IV  40 mg Intravenous Q12H   polyethylene glycol  17 g Per Tube Daily   Continuous Infusions:  sodium chloride     dexmedetomidine (PRECEDEX) IV infusion Stopped (05-02-23 0400)   fentaNYL infusion INTRAVENOUS 100 mcg/hr (2023-05-02 0153)   propofol (DIPRIVAN) infusion 30 mcg/kg/min (02-May-2023 0214)   PRN Meds:.docusate sodium, fentaNYL, midazolam, polyethylene glycol   Active Hospital Problem list   See systems below  Assessment & Plan:   #Acute Hypoxic Respiratory Failure #Acute Drug Intoxication #Pneumonia -Full vent support -Wean PEEP and FiO2 for sats greater than 90% -pulmonary hygiene -SBT/SAT -Follow chest x-ray, ABG prn.  -prn fentanyl, propofol  for RASS -1   #Toxic Metabolic Encephalopathy #Acute Drug Intoxication Hx of Cocaine use -Urinalysis + Urine Tox for cocaine -EKG (check prolongd QRS, QT) -CTH negative   #Acute Symptomatic Anemia of unknown source Concerns for Hemolytic anemia/stigmata of extravascular RBC destruction? Appears she has been c/o back pain, generalized body aches Hgb on admission 5.8, was 13.6 on 05/14/22 -Monitor for S/Sx of bleeding -Trend CBC (H&H q6h) -Check Anemia panel, Hapto, LDH, Fibrinogen -Hold -Protonix 40 mg IV BID although low suspicion for GI causes -SCD's for VTE Prophylaxis (chemical ppx contraindicated) -Transfuse for Hgb <7 pending type and screen -Hold NSAIDs, ASA/anticoagulation -Hemem/onc Consult, Discussed with Dr. Guy Sandifer who will evaluate    #Sepsis due to suspected Pneumonia meets SIRS criteria: Heart Rate 102 beats/minute, Respiratory Rate 36 breaths/minute,Temperature 104.6 -Supplemental oxygen as needed, to maintain SpO2 > 90% -F/u cultures, trend lactic/  PCT -Monitor WBC/ fever curve -IV antibiotics: ceftriaxone and Azithromycin -IVF hydration as needed -Pressors for MAP goal >65 -Strict I/O's    #Hx of CVA #Hypertension #HLD -Hold DUAP  Aspirin 81 mg and Plavix 75 mg for stroke prevention -Hold BP meds for now -Atorvastatin 80mg  PO at bedtime   Best practice:  Diet:  NPO Pain/Anxiety/Delirium protocol (if indicated): Yes (RASS goal -1) VAP protocol (if indicated): Yes DVT prophylaxis: Contraindicated GI prophylaxis: PPI Glucose control:  SSI Yes  Central venous access:  N/A Arterial line:  N/A Foley:  Yes, and it is still needed Mobility:  bed rest  PT consulted: N/A Last date of multidisciplinary goals of care discussion [unable to discuss with pt/family] Code Status:  full code Disposition: ICU   = Goals of Care = Code Status Order: FULL  Primary Emergency Contact: Allred,Brenda, Home Phone: [phone] Unable to discussed goals of care due to patient currently intubated and sedated. No family at the bedside will continue full aggressive treatment and intervention, including CPR and intubation,   Critical care time: 5 minutes        Webb Silversmith DNP, CCRN, FNP-C, AGACNP-BC Acute Care & Family Nurse Practitioner [facility] PCCM on call pager [phone]

## 2023-05-19 NOTE — Progress Notes (Signed)
Initial Nutrition Assessment  DOCUMENTATION CODES:   Not applicable  INTERVENTION:   Vital 1.2@60ml /hr- Initiate at 64ml/hr and increase by 58ml/hr q 8 hours until goal rate is reached  Free water flushes 30ml q4 hours to maintain tube patency   Regimen provides 1728kcal/day, 108g/day protein and 137ml/day of free water.   Pt at high refeed risk; recommend monitor potassium, magnesium and phosphorus labs daily until stable  Daily weights   NUTRITION DIAGNOSIS:   Inadequate oral intake related to inability to eat (pt sedated and venilated) as evidenced by NPO status.  GOAL:   Provide needs based on ASPEN/SCCM guidelines  MONITOR:   Vent status, Labs, Weight trends, TF tolerance, Skin, I & O's  REASON FOR ASSESSMENT:   Ventilator    ASSESSMENT:   62 y/o female with h/o CVA, cocaine abuse, HTN and HLD who is admitted with toxic metabolic encephalopathy, PNA, sepsis and anemia.  Pt sedated and ventilated. OGT in place. Will plan to initiate tube feeds today. Per chart, pt appears weight stable at baseline.   Medications reviewed and include: colace, pepcid, folic acid, insulin, solu-medrol, protonix, miralax, azithromycin, ceftriaxone, levophed   Labs reviewed: K 4.9 wnl, BUN 28(H), P 3.3 wnl, Mg 2.2 wnl Wbc- 17.1(H), Hgb 5.6(L) Cbgs- 172, 51, 50, 13, 128, 112 x 24 hrs   Patient is currently intubated on ventilator support MV: 14.7 L/min Temp (24hrs), Avg:96.5 F (35.8 C), Min:93.4 F (34.1 C), Max:98.1 F (36.7 C)  Propofol: 4.71 ml/hr- provides 124kcal/day   MAP- >46mmHg   UOP-   NUTRITION - FOCUSED PHYSICAL EXAM:  Flowsheet Row Most Recent Value  Orbital Region Moderate depletion  Upper Arm Region No depletion  Thoracic and Lumbar Region No depletion  Buccal Region No depletion  Temple Region Mild depletion  Clavicle Bone Region No depletion  Clavicle and Acromion Bone Region No depletion  Scapular Bone Region No depletion  Dorsal Hand Mild  depletion  Patellar Region Mild depletion  Anterior Thigh Region Mild depletion  Posterior Calf Region Mild depletion  Edema (RD Assessment) None  Hair Reviewed  Eyes Reviewed  Mouth Reviewed  Skin Reviewed  Nails Reviewed   Diet Order:   Diet Order             Diet NPO time specified  Diet effective now                  EDUCATION NEEDS:   No education needs have been identified at this time  Skin:  Skin Assessment: Reviewed RN Assessment  Last BM:  pta  Height:   Ht Readings from Last 1 Encounters:  05/03/2023 5\' 7"  (1.702 m)    Weight:   Wt Readings from Last 1 Encounters:  04/30/2023 71.6 kg    Ideal Body Weight:  61.36 kg  BMI:  Body mass index is 24.72 kg/m.  Estimated Nutritional Needs:   Kcal:  1641kcal/day  Protein:  95-105g/day  Fluid:  1.9-2.2L/day  Betsey Holiday MS, RD, LDN Please refer to [facility] for RD and/or RD on-call/weekend/after hours pager

## 2023-05-19 NOTE — Progress Notes (Signed)
Pt transported to CT then to ICU 2 on the vent without incident. Pt remains on the vent and is tol well . Report given to ICU RT.

## 2023-05-19 NOTE — Procedures (Signed)
  Patient Name: Desiree Sexton   MRN: [id number]   Date of Birth/ Sex: Dec 21, 1960 , female      Admission Date: 04/24/2023  Attending Provider: Janann Colonel, MD  Primary Diagnosis: Acute drug intoxication with complication [facility])    PROCEDURE: U/S Guided Internal jugular central venous catheter   INDICATION (S): Medication administration and Difficult access  PROCEDURE OPERATOR: Webb Silversmith NP  CONSENT: Unable to obtain consent due to emergent nature of procedure.  PROCEDURE SUMMARY: A time-out was performed. The patient's <RIGHT> neck region was prepped and draped in sterile fashion using chlorhexidine scrub. Anesthesia was achieved with 1% lidocaine. The <RIGHT> internal jugular vein was accessed under ultrasound guidance using a finder needle and sheath. U/S images were permanently documented. Venous blood was withdrawn and the sheath was advanced into the vein and the needle was withdrawn. A guidewire was advanced through the sheath. A small incision was made with a 10-blade scalpel and the sheath was exchanged for a dilator over the guidewire until appropriate dilation was obtained. The dilator was removed and an 8.5 [place] central venous quad-lumen catheter was advanced over the guidewire and secured into place with 4 sutures at <_20_> cm. At time of procedure completion, all ports aspirated and flushed properly. Post-procedure x-ray shows the tip of the catheter within the superior vena cava.  COMPLICATIONS: None; patient tolerated the procedure well. Chest X-ray is ordered to verify placement for internal jugular or subclavian cannulation.   Chest x-ray is not ordered for femoral cannulation.  ESTIMATED BLOOD LOSS: NONE   Webb Silversmith, DNP, CCRN, FNP-C, AGACNP-BC Acute Care & Family Nurse Practitioner  [facility]  See Amion for personal pager PCCM on call pager [phone] until 7 am

## 2023-05-19 NOTE — Significant Event (Addendum)
Patient was admitted for hypoxic respiratory failure requiring intubation mechanical ventilation in the emergency department on 09/4.  Mainly intubated for mental status and airway protection.  She was then noted to be anemic at 5.4 and workup revealed that she was in hemolytic anemia.  Hemoglobin was noted to be 5.6 at around 6 AM with a lactate of 4.4.  Case was discussed with hematology attending on service and recommended holding off on transfusion right now until crossmatching as patient has antibodies positive.  However he did recommend if patient were to decompensate then proceed with emergent transfusion.  Labs were repeated at 12 PM and showed worsening lactic acid at 9 and hemoglobin of 3.5.  At 1 PM proceeded with emergent transfusion however while I was in the room with the patient we noted her becoming hypotensive and bradycardic.  ABG was done and was consistent with metabolic acidosis with a pH of 7.1 and a bicarb of 8.  Vent was addressed to increased minute ventilation.  However during the process of addressing the vent patient became severely bradycardic and became pulseless with CPR started at 14:58.  We regained pulse after 2 rounds of CPR and 2 mg of epinephrine.  However after a minute or 2 we lost her pulse again and started CPR again, epinephrine was given, x2bicarb pushes were given.  Calcium chloride x 2 was given.  Her POC showed glucose of 20.  Dextrose was given.  As we were going with CPR massive transfusion was undergone and total of 4 packed RBC were given.  After total of 28 minutes of CPR ROSC was again obtained.  Patient was on norepinephrine and epinephrine.  Repeat labs were sent.  Family was updated at bedside.  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   Janann Colonel, MD.

## 2023-05-19 NOTE — Significant Event (Signed)
1458: Responded to overhead page of Code Blue. On arrival, pt being bagged with high flow O2 with bvm, effective CPR being performed. 1458 1 am p bicarb pushed followed with ns flush. CPR continues 1500: 1 amp epinephrine given followed with NS flush. CPR continues 1501 Calcium Chloride given IV push followed with saline flush. CPR continues 1503: ROSC. CPR stopped. Continue monitoring pt. Epi drip hanging, fluid bolus running, 2 units of PRBC infusing. Providers, RT, ICU staff present.  1507 CBG 20 1507 1 amp glucose given followed by flush 15091 amp glucose given followed by NS flush 1510 Loss of pulse. CPR initiated again, ventilated with high flow O2 by bvm 1510 Epi given followed by NS flush. Effective CPR continues 1512 CBG 360 1512 Calcium given followed by NS flush 1512 +Pulse 1515 Femoral line attempt in L groin. Sterile technique followed by provider.  1524 Pulse lost, cpr initiated with ventilation by bvm, high flow O2 1524 Epi push given, followed with NS flush and CPR 1527: family brought to bedside. Chaplin present. Encouraged to speak to patient and hold hand.Staff member asked family if they would like to pray and prayer said with family present. 1528 Epi drip increased.  Fluids on rapid infuser. 1600:staff remains in room. Family still present at bedside. Through out code, effective CPR performed, CPR providers switched out frequently. Providers remained in room during resus process. Provider allowed family to ask questions and all questions answered

## 2023-05-19 NOTE — Progress Notes (Signed)
   04/30/2023 2100  Spiritual Encounters  Type of Visit Follow up  Care provided to: Pt and family  Conversation partners present during encounter Nurse  Referral source Chaplain assessment  Reason for visit Urgent spiritual support  OnCall Visit Yes  Spiritual Framework  Presenting Themes Courage hope and growth;Impactful experiences and emotions;Significant life change  Community/Connection Family;Friend(s)  Patient Stress Factors Health changes  Family Stress Factors Family relationships  Interventions  Spiritual Care Interventions Made Compassionate presence;Encouragement;Decision-making support/facilitation  Spiritual Care Plan  Spiritual Care Issues Still Outstanding No further spiritual care needs at this time (see row info)   Chaplain went to visit patient and family for a follow up visit. Family was happy to see the chaplain and shared that the patient is still doing the same. Patients mother is going to spend the night with the patient. Family talk to the chaplain about familial issues and asked that the chaplain continue to pray for them. Chaplain is available for spiritual and emotional support.

## 2023-05-19 NOTE — Progress Notes (Signed)
   05/12/2023 1600  Spiritual Encounters  Type of Visit Initial  Care provided to: Pt and family  Conversation partners present during encounter Nurse;Physician  Referral source Code page;Trauma page  Reason for visit Code  OnCall Visit Yes  Spiritual Framework  Presenting Themes Impactful experiences and emotions  Community/Connection Family  Patient Stress Factors Health changes  Family Stress Factors Family relationships  Interventions  Spiritual Care Interventions Made Reflective listening;Compassionate presence;Prayer;Encouragement;Supported grief process;Provided grief education  Spiritual Care Plan  Spiritual Care Issues Still Outstanding No further spiritual care needs at this time (see row info)   Chaplain received a CODE BLUE for the patient. Medical team was working on the patient when chaplain arrived. Chaplain set with the patients family in the waiting room and then escorted them to the patients room. Chaplain prayed with the family and also prayed over the patients body. Family is by the patients bedside waiting for her to transition.Chaplain talked with family about bereavement info as well. Chaplain remains available for support if the family needs.

## 2023-05-19 NOTE — Death Summary Note (Signed)
Collection Time: 04/25/2023  3:40 AM   Specimen: Nasal Mucosa; Nasal Swab  Result Value Ref Range Status   MRSA by PCR Next Gen NOT DETECTED NOT DETECTED Final    Comment: (NOTE) The GeneXpert MRSA Assay (FDA approved for NASAL specimens only), is one component of a comprehensive MRSA colonization surveillance program. It is not intended to diagnose MRSA infection nor to guide or monitor treatment for MRSA infections. Test performance is not FDA approved in patients less than 61 years old. Performed at [facility], [address] [place], [place] [address]   Culture, blood (Routine X 2) w Reflex to ID Panel     Status: None (Preliminary result)   Collection Time: 05/16/2023  3:51 AM   Specimen: BLOOD RIGHT ARM  Result Value Ref Range Status   Specimen Description BLOOD RIGHT ARM  Final   Special Requests   Final    BOTTLES DRAWN AEROBIC ONLY Blood Culture adequate volume   Culture   Final    NO GROWTH < 12 HOURS Performed at [facility], [address] [place], [place] [address]    Report Status PENDING  Incomplete  Culture, blood (Routine X 2) w Reflex to ID Panel     Status: None (Preliminary result)   Collection Time: 05/05/2023  3:51 AM   Specimen: BLOOD LEFT ARM  Result Value Ref Range Status   Specimen Description BLOOD LEFT ARM  Final   Special Requests   Final    BOTTLES DRAWN AEROBIC ONLY Blood Culture adequate volume   Culture   Final    NO GROWTH < 12  HOURS Performed at [facility], [address] [place], [place] [address]    Report Status PENDING  Incomplete  Respiratory (~20 pathogens) panel by PCR     Status: None   Collection Time: 05/12/2023 10:36 AM   Specimen: Nasopharyngeal Swab; Respiratory  Result Value Ref Range Status   Adenovirus NOT DETECTED NOT DETECTED Final   Coronavirus 229E NOT DETECTED NOT DETECTED Final    Comment: (NOTE) The Coronavirus on the Respiratory Panel, DOES NOT test for the novel  Coronavirus (2019 nCoV)    Coronavirus HKU1 NOT DETECTED NOT DETECTED Final   Coronavirus NL63 NOT DETECTED NOT DETECTED Final   Coronavirus OC43 NOT DETECTED NOT DETECTED Final   Metapneumovirus NOT DETECTED NOT DETECTED Final   Rhinovirus / Enterovirus NOT DETECTED NOT DETECTED Final   Influenza A NOT DETECTED NOT DETECTED Final   Influenza B NOT DETECTED NOT DETECTED Final   Parainfluenza Virus 1 NOT DETECTED NOT DETECTED Final   Parainfluenza Virus 2 NOT DETECTED NOT DETECTED Final   Parainfluenza Virus 3 NOT DETECTED NOT DETECTED Final   Parainfluenza Virus 4 NOT DETECTED NOT DETECTED Final   Respiratory Syncytial Virus NOT DETECTED NOT DETECTED Final   Bordetella pertussis NOT DETECTED NOT DETECTED Final   Bordetella Parapertussis NOT DETECTED NOT DETECTED Final   Chlamydophila pneumoniae NOT DETECTED NOT DETECTED Final   Mycoplasma pneumoniae NOT DETECTED NOT DETECTED Final    Comment: Performed at [facility], [address] [place], [place] [address]    Lab Basic Metabolic Panel: Recent Labs  Lab 04/26/2023 2336 05/15/2023 2358 05/05/2023 0351 05/15/2023 0622 05/03/2023 1613  NA 140  --   --  137 140  K 3.8  --   --  4.9 6.3*  CL 107  --   --  108 106  CO2 26  --   --  Collection Time: 04/25/2023  3:40 AM   Specimen: Nasal Mucosa; Nasal Swab  Result Value Ref Range Status   MRSA by PCR Next Gen NOT DETECTED NOT DETECTED Final    Comment: (NOTE) The GeneXpert MRSA Assay (FDA approved for NASAL specimens only), is one component of a comprehensive MRSA colonization surveillance program. It is not intended to diagnose MRSA infection nor to guide or monitor treatment for MRSA infections. Test performance is not FDA approved in patients less than 61 years old. Performed at [facility], [address] [place], [place] [address]   Culture, blood (Routine X 2) w Reflex to ID Panel     Status: None (Preliminary result)   Collection Time: 05/16/2023  3:51 AM   Specimen: BLOOD RIGHT ARM  Result Value Ref Range Status   Specimen Description BLOOD RIGHT ARM  Final   Special Requests   Final    BOTTLES DRAWN AEROBIC ONLY Blood Culture adequate volume   Culture   Final    NO GROWTH < 12 HOURS Performed at [facility], [address] [place], [place] [address]    Report Status PENDING  Incomplete  Culture, blood (Routine X 2) w Reflex to ID Panel     Status: None (Preliminary result)   Collection Time: 05/05/2023  3:51 AM   Specimen: BLOOD LEFT ARM  Result Value Ref Range Status   Specimen Description BLOOD LEFT ARM  Final   Special Requests   Final    BOTTLES DRAWN AEROBIC ONLY Blood Culture adequate volume   Culture   Final    NO GROWTH < 12  HOURS Performed at [facility], [address] [place], [place] [address]    Report Status PENDING  Incomplete  Respiratory (~20 pathogens) panel by PCR     Status: None   Collection Time: 05/12/2023 10:36 AM   Specimen: Nasopharyngeal Swab; Respiratory  Result Value Ref Range Status   Adenovirus NOT DETECTED NOT DETECTED Final   Coronavirus 229E NOT DETECTED NOT DETECTED Final    Comment: (NOTE) The Coronavirus on the Respiratory Panel, DOES NOT test for the novel  Coronavirus (2019 nCoV)    Coronavirus HKU1 NOT DETECTED NOT DETECTED Final   Coronavirus NL63 NOT DETECTED NOT DETECTED Final   Coronavirus OC43 NOT DETECTED NOT DETECTED Final   Metapneumovirus NOT DETECTED NOT DETECTED Final   Rhinovirus / Enterovirus NOT DETECTED NOT DETECTED Final   Influenza A NOT DETECTED NOT DETECTED Final   Influenza B NOT DETECTED NOT DETECTED Final   Parainfluenza Virus 1 NOT DETECTED NOT DETECTED Final   Parainfluenza Virus 2 NOT DETECTED NOT DETECTED Final   Parainfluenza Virus 3 NOT DETECTED NOT DETECTED Final   Parainfluenza Virus 4 NOT DETECTED NOT DETECTED Final   Respiratory Syncytial Virus NOT DETECTED NOT DETECTED Final   Bordetella pertussis NOT DETECTED NOT DETECTED Final   Bordetella Parapertussis NOT DETECTED NOT DETECTED Final   Chlamydophila pneumoniae NOT DETECTED NOT DETECTED Final   Mycoplasma pneumoniae NOT DETECTED NOT DETECTED Final    Comment: Performed at [facility], [address] [place], [place] [address]    Lab Basic Metabolic Panel: Recent Labs  Lab 04/26/2023 2336 05/15/2023 2358 05/05/2023 0351 05/15/2023 0622 05/03/2023 1613  NA 140  --   --  137 140  K 3.8  --   --  4.9 6.3*  CL 107  --   --  108 106  CO2 26  --   --  Collection Time: 04/25/2023  3:40 AM   Specimen: Nasal Mucosa; Nasal Swab  Result Value Ref Range Status   MRSA by PCR Next Gen NOT DETECTED NOT DETECTED Final    Comment: (NOTE) The GeneXpert MRSA Assay (FDA approved for NASAL specimens only), is one component of a comprehensive MRSA colonization surveillance program. It is not intended to diagnose MRSA infection nor to guide or monitor treatment for MRSA infections. Test performance is not FDA approved in patients less than 61 years old. Performed at [facility], [address] [place], [place] [address]   Culture, blood (Routine X 2) w Reflex to ID Panel     Status: None (Preliminary result)   Collection Time: 05/16/2023  3:51 AM   Specimen: BLOOD RIGHT ARM  Result Value Ref Range Status   Specimen Description BLOOD RIGHT ARM  Final   Special Requests   Final    BOTTLES DRAWN AEROBIC ONLY Blood Culture adequate volume   Culture   Final    NO GROWTH < 12 HOURS Performed at [facility], [address] [place], [place] [address]    Report Status PENDING  Incomplete  Culture, blood (Routine X 2) w Reflex to ID Panel     Status: None (Preliminary result)   Collection Time: 05/05/2023  3:51 AM   Specimen: BLOOD LEFT ARM  Result Value Ref Range Status   Specimen Description BLOOD LEFT ARM  Final   Special Requests   Final    BOTTLES DRAWN AEROBIC ONLY Blood Culture adequate volume   Culture   Final    NO GROWTH < 12  HOURS Performed at [facility], [address] [place], [place] [address]    Report Status PENDING  Incomplete  Respiratory (~20 pathogens) panel by PCR     Status: None   Collection Time: 05/12/2023 10:36 AM   Specimen: Nasopharyngeal Swab; Respiratory  Result Value Ref Range Status   Adenovirus NOT DETECTED NOT DETECTED Final   Coronavirus 229E NOT DETECTED NOT DETECTED Final    Comment: (NOTE) The Coronavirus on the Respiratory Panel, DOES NOT test for the novel  Coronavirus (2019 nCoV)    Coronavirus HKU1 NOT DETECTED NOT DETECTED Final   Coronavirus NL63 NOT DETECTED NOT DETECTED Final   Coronavirus OC43 NOT DETECTED NOT DETECTED Final   Metapneumovirus NOT DETECTED NOT DETECTED Final   Rhinovirus / Enterovirus NOT DETECTED NOT DETECTED Final   Influenza A NOT DETECTED NOT DETECTED Final   Influenza B NOT DETECTED NOT DETECTED Final   Parainfluenza Virus 1 NOT DETECTED NOT DETECTED Final   Parainfluenza Virus 2 NOT DETECTED NOT DETECTED Final   Parainfluenza Virus 3 NOT DETECTED NOT DETECTED Final   Parainfluenza Virus 4 NOT DETECTED NOT DETECTED Final   Respiratory Syncytial Virus NOT DETECTED NOT DETECTED Final   Bordetella pertussis NOT DETECTED NOT DETECTED Final   Bordetella Parapertussis NOT DETECTED NOT DETECTED Final   Chlamydophila pneumoniae NOT DETECTED NOT DETECTED Final   Mycoplasma pneumoniae NOT DETECTED NOT DETECTED Final    Comment: Performed at [facility], [address] [place], [place] [address]    Lab Basic Metabolic Panel: Recent Labs  Lab 04/26/2023 2336 05/15/2023 2358 05/05/2023 0351 05/15/2023 0622 05/03/2023 1613  NA 140  --   --  137 140  K 3.8  --   --  4.9 6.3*  CL 107  --   --  108 106  CO2 26  --   --  Collection Time: 04/25/2023  3:40 AM   Specimen: Nasal Mucosa; Nasal Swab  Result Value Ref Range Status   MRSA by PCR Next Gen NOT DETECTED NOT DETECTED Final    Comment: (NOTE) The GeneXpert MRSA Assay (FDA approved for NASAL specimens only), is one component of a comprehensive MRSA colonization surveillance program. It is not intended to diagnose MRSA infection nor to guide or monitor treatment for MRSA infections. Test performance is not FDA approved in patients less than 61 years old. Performed at [facility], [address] [place], [place] [address]   Culture, blood (Routine X 2) w Reflex to ID Panel     Status: None (Preliminary result)   Collection Time: 05/16/2023  3:51 AM   Specimen: BLOOD RIGHT ARM  Result Value Ref Range Status   Specimen Description BLOOD RIGHT ARM  Final   Special Requests   Final    BOTTLES DRAWN AEROBIC ONLY Blood Culture adequate volume   Culture   Final    NO GROWTH < 12 HOURS Performed at [facility], [address] [place], [place] [address]    Report Status PENDING  Incomplete  Culture, blood (Routine X 2) w Reflex to ID Panel     Status: None (Preliminary result)   Collection Time: 05/05/2023  3:51 AM   Specimen: BLOOD LEFT ARM  Result Value Ref Range Status   Specimen Description BLOOD LEFT ARM  Final   Special Requests   Final    BOTTLES DRAWN AEROBIC ONLY Blood Culture adequate volume   Culture   Final    NO GROWTH < 12  HOURS Performed at [facility], [address] [place], [place] [address]    Report Status PENDING  Incomplete  Respiratory (~20 pathogens) panel by PCR     Status: None   Collection Time: 05/12/2023 10:36 AM   Specimen: Nasopharyngeal Swab; Respiratory  Result Value Ref Range Status   Adenovirus NOT DETECTED NOT DETECTED Final   Coronavirus 229E NOT DETECTED NOT DETECTED Final    Comment: (NOTE) The Coronavirus on the Respiratory Panel, DOES NOT test for the novel  Coronavirus (2019 nCoV)    Coronavirus HKU1 NOT DETECTED NOT DETECTED Final   Coronavirus NL63 NOT DETECTED NOT DETECTED Final   Coronavirus OC43 NOT DETECTED NOT DETECTED Final   Metapneumovirus NOT DETECTED NOT DETECTED Final   Rhinovirus / Enterovirus NOT DETECTED NOT DETECTED Final   Influenza A NOT DETECTED NOT DETECTED Final   Influenza B NOT DETECTED NOT DETECTED Final   Parainfluenza Virus 1 NOT DETECTED NOT DETECTED Final   Parainfluenza Virus 2 NOT DETECTED NOT DETECTED Final   Parainfluenza Virus 3 NOT DETECTED NOT DETECTED Final   Parainfluenza Virus 4 NOT DETECTED NOT DETECTED Final   Respiratory Syncytial Virus NOT DETECTED NOT DETECTED Final   Bordetella pertussis NOT DETECTED NOT DETECTED Final   Bordetella Parapertussis NOT DETECTED NOT DETECTED Final   Chlamydophila pneumoniae NOT DETECTED NOT DETECTED Final   Mycoplasma pneumoniae NOT DETECTED NOT DETECTED Final    Comment: Performed at [facility], [address] [place], [place] [address]    Lab Basic Metabolic Panel: Recent Labs  Lab 04/26/2023 2336 05/15/2023 2358 05/05/2023 0351 05/15/2023 0622 05/03/2023 1613  NA 140  --   --  137 140  K 3.8  --   --  4.9 6.3*  CL 107  --   --  108 106  CO2 26  --   --  Collection Time: 04/25/2023  3:40 AM   Specimen: Nasal Mucosa; Nasal Swab  Result Value Ref Range Status   MRSA by PCR Next Gen NOT DETECTED NOT DETECTED Final    Comment: (NOTE) The GeneXpert MRSA Assay (FDA approved for NASAL specimens only), is one component of a comprehensive MRSA colonization surveillance program. It is not intended to diagnose MRSA infection nor to guide or monitor treatment for MRSA infections. Test performance is not FDA approved in patients less than 61 years old. Performed at [facility], [address] [place], [place] [address]   Culture, blood (Routine X 2) w Reflex to ID Panel     Status: None (Preliminary result)   Collection Time: 05/16/2023  3:51 AM   Specimen: BLOOD RIGHT ARM  Result Value Ref Range Status   Specimen Description BLOOD RIGHT ARM  Final   Special Requests   Final    BOTTLES DRAWN AEROBIC ONLY Blood Culture adequate volume   Culture   Final    NO GROWTH < 12 HOURS Performed at [facility], [address] [place], [place] [address]    Report Status PENDING  Incomplete  Culture, blood (Routine X 2) w Reflex to ID Panel     Status: None (Preliminary result)   Collection Time: 05/05/2023  3:51 AM   Specimen: BLOOD LEFT ARM  Result Value Ref Range Status   Specimen Description BLOOD LEFT ARM  Final   Special Requests   Final    BOTTLES DRAWN AEROBIC ONLY Blood Culture adequate volume   Culture   Final    NO GROWTH < 12  HOURS Performed at [facility], [address] [place], [place] [address]    Report Status PENDING  Incomplete  Respiratory (~20 pathogens) panel by PCR     Status: None   Collection Time: 05/08/2023 10:36 AM   Specimen: Nasopharyngeal Swab; Respiratory  Result Value Ref Range Status   Adenovirus NOT DETECTED NOT DETECTED Final   Coronavirus 229E NOT DETECTED NOT DETECTED Final    Comment: (NOTE) The Coronavirus on the Respiratory Panel, DOES NOT test for the novel  Coronavirus (2019 nCoV)    Coronavirus HKU1 NOT DETECTED NOT DETECTED Final   Coronavirus NL63 NOT DETECTED NOT DETECTED Final   Coronavirus OC43 NOT DETECTED NOT DETECTED Final   Metapneumovirus NOT DETECTED NOT DETECTED Final   Rhinovirus / Enterovirus NOT DETECTED NOT DETECTED Final   Influenza A NOT DETECTED NOT DETECTED Final   Influenza B NOT DETECTED NOT DETECTED Final   Parainfluenza Virus 1 NOT DETECTED NOT DETECTED Final   Parainfluenza Virus 2 NOT DETECTED NOT DETECTED Final   Parainfluenza Virus 3 NOT DETECTED NOT DETECTED Final   Parainfluenza Virus 4 NOT DETECTED NOT DETECTED Final   Respiratory Syncytial Virus NOT DETECTED NOT DETECTED Final   Bordetella pertussis NOT DETECTED NOT DETECTED Final   Bordetella Parapertussis NOT DETECTED NOT DETECTED Final   Chlamydophila pneumoniae NOT DETECTED NOT DETECTED Final   Mycoplasma pneumoniae NOT DETECTED NOT DETECTED Final    Comment: Performed at [facility], [address] [place], [place] [address]    Lab Basic Metabolic Panel: Recent Labs  Lab 04/26/2023 2336 05/15/2023 2358 05/05/2023 0351 05/15/2023 0622 05/03/2023 1613  NA 140  --   --  137 140  K 3.8  --   --  4.9 6.3*  CL 107  --   --  108 106  CO2 26  --   --

## 2023-05-19 NOTE — Progress Notes (Signed)
I spoke to patient family-mother and brother at the bedside.  Patient coded currently on pressors.    Discussed the difficult situation where we were unable to transfuse blood given her antibodies. Discussed that her prognosis is very poor.   GB

## 2023-05-19 NOTE — Progress Notes (Signed)
eLink Physician-Brief Progress Note Patient Name: Vanecia Grime DOB: Dec 13, 1960 MRN: [id number]   Date of Service  04/26/2023  HPI/Events of Note  62 year old female that initially presented to the emergency department with complaints of generalized bodyaches in the setting of drug use found to be encephalopathic and intubated in the emergency department.  She presented with hypothermia, hypotension, and otherwise normal vital signs.  After intubation she became mildly hypotensive-sedated with propofol and fentanyl.  Initial laboratory studies with Minor electrolyte disturbances, pending CBC in the setting of likely anemia and potential hemolysis and thrombocytopenia.  Coag studies fairly unremarkable.  Urine drug screen positive for benzodiazepines and cocaine.  CT chest/abdomen/pelvis without alert findings.  CT head without acute findings.  ECG fairly unremarkable.  eICU Interventions  Continue mechanical ventilation with sedation/analgesia-propofol/fentanyl.  Resume home meds as tolerated  DVT prophylaxis held in the setting of anemia, potential transfusion pending  GI prophylaxis with pantoprazole therapeutic dosing every 12 hours  Complete current team assessment pending     Intervention Category Evaluation Type: New Patient Evaluation  Elgene Coral 05/07/2023, 6:49 AM

## 2023-05-19 NOTE — Progress Notes (Signed)
Blood bank and laboratory informed this RN with news of difficulty processing bloodwork. Orders have been placed for a re-collect of the BMP, phosphorous, and type and screen. NP made aware.  Carmel Sacramento, RN

## 2023-05-19 NOTE — Progress Notes (Signed)
Patient unresponsive, on ventilator with stable pressures, NSR, pupils 3/sluggish. Awaiting type and screen for blood transfusion per Hematology. Plan for patient/lab values discussed at rounds with MD. Max Sane in place.   Patient in SBT, sedation off,tolerating well. Bear hugger stopped. Temperatures checked axillary, stable.  Patient unresponsive, St, increased Bps, and RR. SBT failed. Sedation resumed. MD aware.  MD made aware pressures dropping. Bolus given. Pressures responding to bolus.   Critical Lactic called to this RN, NP made aware, Bolus given.   Family at bedside, updating family. Patient began decompensating. Levo started, and interventions performed. H&H resulted, emergent blood requested. MD explained situation to family, family to waiting room. Patient lost pulse, ACLS initiated, code blue called. 2 units of emergent blood given. 2 additional given by rapid infuser by ED RN. Medications given per ACLS protocol. See code note.   Family returned to bedside, updated by MD.  Patient blood pressures holding on pressors. Titirated per MD verbal orders. Labs repeated. MD made aware of results.   Critical reported to NP and MD.  Family remains at bedside,updates provided.

## 2023-05-19 NOTE — Progress Notes (Signed)
PHARMACY CONSULT NOTE - FOLLOW UP  Pharmacy Consult for Electrolyte Monitoring and Replacement   Recent Labs: Potassium (mmol/L)  Date Value  05/03/2023 4.9   Magnesium (mg/dL)  Date Value  95/62/1308 2.2   Calcium (mg/dL)  Date Value  65/78/4696 8.7 (L)   Albumin (g/dL)  Date Value  29/52/8413 3.9   Phosphorus (mg/dL)  Date Value  24/40/1027 3.3   Sodium (mmol/L)  Date Value  05/10/2023 137     Assessment: 62 y.o. female admitted on 05/04/2023 for altered mental status in the setting of acute drug intoxication. 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 Pharmacy is asked to follow and replace electrolytes while in CCU.  Patient is currently intubated for airway protection. UDS + for cocaine and benzodiazepines.  Goal of Therapy:  Electrolytes WNL  Plan:  No electrolyte supplementation necessary today Recheck electrolytes tomorrow morning  Darolyn Rua, PharmD Student [employer]

## 2023-05-19 NOTE — Consult Note (Signed)
Cancer Center CONSULT NOTE  Patient Care Team: Center, [facility] as PCP - General (General Practice) Jim Like, RN as Registered Nurse Scarlett Presto, RN (Inactive) as Registered Nurse  CHIEF COMPLAINTS/PURPOSE OF CONSULTATION: Severe anemia  Oncology History   No history exists.    HISTORY OF PRESENTING ILLNESS: Please note patient is currently intubated.   Desiree Sexton 62 y.o.  female patient with a history of cocaine abuse-is currently admitted to hospital for mental status changes; currently status post intubation for airway protection.  Noted to have severe anemia hemoglobin 5.   Labs all suggestive of hemolysis including elevated bilirubin; indirect bilirubin; LDH 900 haptoglobin pending.  Reticulocyte count-normal however not appropriate for the anemia anemia.   CT scan showed mediastinal and hilar adenopathy; and also enlarged spleen.  Hematology has been consulted for further evaluation recommendations for severe anemia.   Review of Systems  Unable to perform ROS: Intubated    MEDICAL HISTORY:  Past Medical History:  Diagnosis Date   Hyperlipidemia    Hypertension    Stroke [facility])     SURGICAL HISTORY: Past Surgical History:  Procedure Laterality Date   TONSILLECTOMY     TUBAL LIGATION      SOCIAL HISTORY: Social History   Socioeconomic History   Marital status: Single    Spouse name: Not on file   Number of children: Not on file   Years of education: Not on file   Highest education level: Not on file  Occupational History   Not on file  Tobacco Use   Smoking status: Every Day    Current packs/day: 0.50    Types: Cigarettes   Smokeless tobacco: Never  Vaping Use   Vaping status: Never Used  Substance and Sexual Activity   Alcohol use: Yes    Comment: quit 05/2020, did have a mixed drink 05/12/22   Drug use: Yes    Frequency: 2.0 times per week    Types: Cocaine   Sexual activity: Yes    Birth  control/protection: Surgical  Other Topics Concern   Not on file  Social History Narrative   Not on file   Social Determinants of Health   Financial Resource Strain: Not on file  Food Insecurity: Food Insecurity Present (12/24/2021)   Hunger Vital Sign    Worried About Running Out of Food in the Last Year: Sometimes true    Ran Out of Food in the Last Year: Sometimes true  Transportation Needs: Unmet Transportation Needs (11/12/2021)   PRAPARE - Administrator, Civil Service (Medical): Yes    Lack of Transportation (Non-Medical): Yes  Physical Activity: Not on file  Stress: Not on file  Social Connections: Not on file  Intimate Partner Violence: Not At Risk (02/25/2022)   Humiliation, Afraid, Rape, and Kick questionnaire    Fear of Current or Ex-Partner: No    Emotionally Abused: No    Physically Abused: No    Sexually Abused: No    FAMILY HISTORY: Family History  Problem Relation Age of Onset   Heart disease Mother        pacemaker   Hypertension Mother    Thyroid disease Mother    Colon cancer Mother    Alcohol abuse Father    Other Father        suicide   Heart disease Maternal Grandmother    Diabetes Maternal Grandmother    Other Maternal Grandfather  unknown medical history   Breast cancer Paternal Grandmother 28   Alcohol abuse Paternal Grandfather     ALLERGIES:  has No Known Allergies.  MEDICATIONS:  Current Facility-Administered Medications  Medication Dose Route Frequency Provider Last Rate Last Admin   0.9 %  sodium chloride infusion  10 mL/hr Intravenous Once Delton Prairie, MD       azithromycin (ZITHROMAX) 500 mg in sodium chloride 0.9 % 250 mL IVPB  500 mg Intravenous Q24H Jimmye Norman, NP   Stopped at 05/15/2023 1009   cefTRIAXone (ROCEPHIN) 1 g in sodium chloride 0.9 % 100 mL IVPB  1 g Intravenous Q24H Jimmye Norman, NP   Stopped at 05/10/2023 1610   Chlorhexidine Gluconate Cloth 2 % PADS 6 each  6 each Topical Daily  Assaker, [place], MD   6 each at 05/18/2023 0906   dexmedetomidine (PRECEDEX) 400 MCG/100ML (4 mcg/mL) infusion  0-1.2 mcg/kg/hr Intravenous Continuous Jimmye Norman, NP 15.7 mL/hr at 04/24/2023 1236 0.8 mcg/kg/hr at 05/07/2023 1236   docusate (COLACE) 50 MG/5ML liquid 100 mg  100 mg Per Tube BID Jimmye Norman, NP   100 mg at 05/02/2023 0905   docusate sodium (COLACE) capsule 100 mg  100 mg Oral BID PRN Jimmye Norman, NP       famotidine (PEPCID) tablet 20 mg  20 mg Per Tube BID Jimmye Norman, NP   20 mg at 05/09/2023 0403   fentaNYL (SUBLIMAZE) bolus via infusion 50-100 mcg  50-100 mcg Intravenous Q1H PRN Jimmye Norman, NP       fentaNYL in NS (52mcg/ml) infusion-PREMIX  100 mcg/hr Intravenous Continuous Delton Prairie, MD   Paused at 04/24/2023 0831   folic acid injection 1 mg  1 mg Intravenous Daily Earna Coder, MD       Immune Globulin 10% (PRIVIGEN) IV infusion 25 g  400 mg/kg (Ideal) Intravenous Q24 Hr x 5 Birch Farino R, MD       insulin aspart (novoLOG) injection 0-9 Units  0-9 Units Subcutaneous Q4H Jimmye Norman, NP   1 Units at 05/02/2023 9604   methylPREDNISolone sodium succinate (SOLU-MEDROL) 125 mg/2 mL injection 125 mg  125 mg Intravenous Q12H Earna Coder, MD       midazolam (VERSED) injection 1-2 mg  1-2 mg Intravenous Q1H PRN Jimmye Norman, NP       Oral care mouth rinse  15 mL Mouth Rinse Q2H Jimmye Norman, NP   15 mL at 05/07/2023 1000   Oral care mouth rinse  15 mL Mouth Rinse PRN Jimmye Norman, NP       pantoprazole (PROTONIX) injection 40 mg  40 mg Intravenous Q12H Jimmye Norman, NP   40 mg at 05/03/2023 0356   polyethylene glycol (MIRALAX / GLYCOLAX) packet 17 g  17 g Oral Daily PRN Jimmye Norman, NP       polyethylene glycol (MIRALAX / GLYCOLAX) packet 17 g  17 g Per Tube Daily Jimmye Norman, NP   17 g at 04/26/2023 0905   propofol  (DIPRIVAN) 1000 MG/100ML infusion  5-80 mcg/kg/min Intravenous Continuous Delton Prairie, MD 4.71 mL/hr at 05/06/2023 1200 10 mcg/kg/min at 05/10/2023 1200    PHYSICAL EXAMINATION:  Vitals:   05/15/2023 1132 04/29/2023 1200  BP: 135/60 (!) 171/75  Pulse: 84 100  Resp: (!) 24 (!) 33  Temp: (!) 96.3 F (35.7 C) (!) 96.3 F (35.7 C)  SpO2: 100% 95%   Filed Weights  05/02/2023 2329 05/09/2023 0350  Weight: 173 lb (78.5 kg) 157 lb 13.6 oz (71.6 kg)    Physical Exam Vitals and nursing note reviewed.  Constitutional:      Comments: Intubated/sedated  HENT:     Head: Normocephalic and atraumatic.  Cardiovascular:     Rate and Rhythm: Regular rhythm. Tachycardia present.  Pulmonary:     Comments: Decreased breath sounds bilaterally.  Coarse breath sounds Abdominal:     General: Abdomen is flat.     Palpations: Abdomen is soft.  Skin:    General: Skin is warm and dry.  Neurological:     Comments: Sedated     LABORATORY DATA:  I have reviewed the data as listed Lab Results  Component Value Date   WBC 17.1 (H) 05/17/2023   HGB 5.6 (L) 05/17/2023   HCT RESULTS UNAVAILABLE DUE TO INTERFERING SUBSTANCE 04/27/2023   MCV RESULTS UNAVAILABLE DUE TO INTERFERING SUBSTANCE 04/20/2023   PLT 149 (L) 05/07/2023   Recent Labs    05/14/22 1200 05/04/2023 2336 05/15/2023 2358 04/19/2023 0622 05/02/2023 0806  NA 145* 140  --  137  --   K 4.2 3.8  --  4.9  --   CL 103 107  --  108  --   CO2 24 26  --  23  --   GLUCOSE 98 111*  --  127*  --   BUN 16 19  --  28*  --   CREATININE 0.95 0.89  --  0.82  --   CALCIUM 9.2 9.3  --  8.7*  --   GFRNONAA  --  >60  --  >60  --   PROT 6.3  --  6.6  --  5.9*  ALBUMIN 5.0*  --  3.9  --  3.3*  AST 14  --  23  --  96*  ALT 13  --  14  --  20  ALKPHOS 132*  --  119  --  129*  BILITOT 0.3  --  4.4*  --  6.0*  BILIDIR  --   --  0.6*  --  1.8*  IBILI  --   --  3.8*  --  4.2*    RADIOGRAPHIC STUDIES: I have personally reviewed the radiological images as listed  and agreed with the findings in the report. DG Chest Port 1 View  Result Date: 04/28/2023 CLINICAL DATA:  Encounter for central line EXAM: PORTABLE CHEST 1 VIEW COMPARISON:  Chest CT from earlier today FINDINGS: Endotracheal tube tip is at the clavicular heads. An enteric tube at least reaches the stomach. Right IJ line with tip at the upper cavoatrial junction. Low volume chest with indistinct density in the lungs. Mediastinal and hilar adenopathy underestimated relative to prior CT. No pneumothorax or pleural effusion. IMPRESSION: New central line without complicating features.  No new abnormality. Electronically Signed   By: Tiburcio Pea M.D.   On: 04/28/2023 07:07   CT CHEST ABDOMEN PELVIS W CONTRAST  Result Date: 05/03/2023 CLINICAL DATA:  Sepsis ALTERED MENTAL STATUS. Alcohol use. Drug use. Aches all over body. EXAM: CT CHEST, ABDOMEN, AND PELVIS WITH CONTRAST TECHNIQUE: Multidetector CT imaging of the chest, abdomen and pelvis was performed following the standard protocol during bolus administration of intravenous contrast. RADIATION DOSE REDUCTION: This exam was performed according to the departmental dose-optimization program which includes automated exposure control, adjustment of the mA and/or kV according to patient size and/or use of iterative reconstruction technique. CONTRAST:  OMNIPAQUE IOHEXOL 300  MG/ML  SOLN COMPARISON:  Chest x-ray 05/12/2023 FINDINGS: CT CHEST FINDINGS Cardiovascular: Normal heart size. No significant pericardial effusion. The thoracic aorta is normal in caliber. Mild atherosclerotic plaque of the thoracic aorta. At least 3 vessel coronary artery calcifications. The main pulmonary artery is normal in caliber. No central or proximal segmental pulmonary embolus. Limited evaluation more distally due to timing of contrast and motion artifact. Mediastinum/Nodes: Mediastinal lymphadenopathy with as an example a 1.9 cm precarinal lymph node (2:21). Enlarged right hilar  lymph node measuring 1.2 cm. Enlarged right left hilar lymph nodes measuring 1.5 cm. Posterior mediastinal and peri esophageal and subcarinal lymphadenopathy measuring up to 1.9 cm. No enlarged axillary lymph nodes. Thyroid gland, trachea, and esophagus demonstrate no significant findings. Lungs/Pleura: Peribronchovascular patchy ground-glass airspace opacities. Bilateral lower lobe subsegmental atelectasis. Limited evaluation for pulmonary nodules. No pulmonary mass. No pleural effusion. No pneumothorax. Musculoskeletal: No chest wall abnormality. No suspicious lytic or blastic osseous lesions. No acute displaced fracture. CT ABDOMEN PELVIS FINDINGS Hepatobiliary: No focal liver abnormality. No gallstones, gallbladder wall thickening, or pericholecystic fluid. No biliary dilatation. Pancreas: No focal lesion. Normal pancreatic contour. No surrounding inflammatory changes. No main pancreatic ductal dilatation. Spleen: The spleen is enlarged measuring up to 15.5 cm. No focal splenic lesion. Adrenals/Urinary Tract: No adrenal nodule bilaterally. Bilateral kidneys enhance symmetrically. No hydronephrosis. No hydroureter. The urinary bladder is decompressed with Foley tip and inflated balloon terminating within its lumen. Stomach/Bowel: Enteric tube courses below the hemidiaphragm with tip and side port within the gastric lumen. Stomach is within normal limits. No evidence of bowel wall thickening or dilatation. Colonic diverticulosis. Appendix appears normal. Vascular/Lymphatic: No abdominal aorta or iliac aneurysm. Severe atherosclerotic plaque of the aorta and its branches. No abdominal, pelvic, or inguinal lymphadenopathy. Reproductive: Uterus and bilateral adnexa are unremarkable. Other: No intraperitoneal free fluid. No intraperitoneal free gas. No organized fluid collection. Musculoskeletal: No abdominal wall hernia or abnormality. No suspicious lytic or blastic osseous lesions. No acute displaced fracture.  IMPRESSION: 1. Endotracheal terminates 1 cm of the carina, recommend retraction by 1 cm. 2. Peribronchovascular patchy ground-glass airspace opacities. Findings suggestive of infection/inflammation such as COVID 19. recommend attention on follow-up to evaluate for complete resolution in 3 months. 3. Mediastinal and bilateral hilar lymphadenopathy-likely reactive in etiology. Recommend follow-up to evaluate for resolution and exclude underlying malignancy/metastases. 4. No central or proximal segmental pulmonary embolus. Limited evaluation more distally due to timing of contrast and motion artifact. 5. Mild splenomegaly. 6. Colonic diverticulosis with no acute diverticulitis. Electronically Signed   By: Tish Frederickson M.D.   On: 05/13/2023 03:48   CT HEAD WO CONTRAST ( )  Result Date: 05/18/2023 CLINICAL DATA:  Mental status change, alcohol/drug use EXAM: CT HEAD WITHOUT CONTRAST TECHNIQUE: Contiguous axial images were obtained from the base of the skull through the vertex without intravenous contrast. RADIATION DOSE REDUCTION: This exam was performed according to the departmental dose-optimization program which includes automated exposure control, adjustment of the mA and/or kV according to patient size and/or use of iterative reconstruction technique. COMPARISON:  MRI head 06/07/2020, CT head 06/07/2020 FINDINGS: Brain: Patchy and confluent areas of decreased attenuation are noted throughout the deep and periventricular white matter of the cerebral hemispheres bilaterally, compatible with chronic microvascular ischemic disease. Chronic right frontal as well as right parieto-occipital and temporal encephalomalacia. Chronic right basal ganglia lacunar infarction. No evidence of large-territorial acute infarction. No parenchymal hemorrhage. No mass lesion. No extra-axial collection. No mass effect or midline shift. No hydrocephalus. Basilar cisterns are  patent. Vascular: No hyperdense vessel. Atherosclerotic  calcifications are present within the cavernous internal carotid and vertebral arteries. Skull: No acute fracture or focal lesion. Sinuses/Orbits: Paranasal sinuses and mastoid air cells are clear. The orbits are unremarkable. Other: None. IMPRESSION: No acute intracranial abnormality in a patient with chronic infarction. Electronically Signed   By: Tish Frederickson M.D.   On: 05/17/2023 03:31   DG Chest Portable 1 View  Result Date: 05/07/2023 CLINICAL DATA:  Intubation, OG tube placement EXAM: PORTABLE CHEST 1 VIEW COMPARISON:  05/03/2023 FINDINGS: Endotracheal tube is 3 cm above the carina. OG tube is in the stomach. Heart and mediastinal contours within normal limits. Aortic atherosclerosis. Continued interstitial prominence throughout the lungs, stable since prior study. No effusions or acute bony abnormality. IMPRESSION: Support devices in expected position as above. Stable interstitial prominence throughout the lungs. Electronically Signed   By: Charlett Nose M.D.   On: 04/24/2023 02:30   DG Chest Portable 1 View  Result Date: 04/21/2023 CLINICAL DATA:  Myalgias, tachypnea. EXAM: PORTABLE CHEST 1 VIEW COMPARISON:  06/07/2020 FINDINGS: Heart and mediastinal contours within normal limits. Aortic atherosclerosis. Interstitial prominence within the lungs. No effusions. No acute bony abnormality. IMPRESSION: Interstitial prominence within the lungs which could reflect interstitial edema or atypical infection. Electronically Signed   By: Charlett Nose M.D.   On: 04/26/2023 01:05     No problem-specific Assessment & Plan notes found for this encounter.  62 year old female patient with a history of cocaine abuse-is currently admitted to hospital for mental status changes; currently status post intubation for airway protection.  Noted to have severe anemia hemoglobin 5.   # Severe anemia-unclear etiology-however suspicious of hemolytic anemia.  As per blood bank-positive for DAT-both warm and cold  antibodies.  No evidence of schistocytes or suggestive of TTP.  # Lymphadenopathy [mediastinal-hilar up to 1 to 2 cm]; enlarged spleen-again suspicious of lymphoproliferative process.   # Recommendation:  # Proceed with IVIG infusion 400 mg/kg daily for 5 days; and also started Solu-Medrol 125 mg twice daily IV.  Consider rituximab if no significant improvement noted. Check hepatitis panel.    # As patient is clinically stable on the ventilator without any hemodynamic compromise-given the difficulty with crossmatching for antibody-I think it is okay to hold off any emergency blood transfusion.  However at any point if patient does have hemodynamic compromise-recommend emergency blood transfusion.  This was discussed with ICU attending.  Thank you Dr.Assaker for allowing me to participate in the care of your pleasant patient. Please do not hesitate to contact me with questions or concerns in the interim.  Above plan of care was discussed with patients boyfriend in detail.      Earna Coder, MD 05/01/2023 12:53 PM

## 2023-05-19 DEATH — deceased

## 2023-06-19 NOTE — Plan of Care (Signed)
[facility] Tonsillectomy/Adenoidectomy,Postoperative PEDs care plan entered in error.
